# Patient Record
Sex: Male | Born: 1995 | Race: Black or African American | Hispanic: No | Marital: Single | Smoking: Former smoker
Health system: Southern US, Community
[De-identification: ages and names within clinical notes are randomized; demographics above are authoritative.]

## PROBLEM LIST (undated history)

## (undated) DIAGNOSIS — Z789 Other specified health status: Secondary | ICD-10-CM

## (undated) DIAGNOSIS — K2970 Gastritis, unspecified, without bleeding: Secondary | ICD-10-CM

## (undated) DIAGNOSIS — E8889 Other specified metabolic disorders: Secondary | ICD-10-CM

## (undated) DIAGNOSIS — E7889 Other lipoprotein metabolism disorders: Secondary | ICD-10-CM

## (undated) DIAGNOSIS — Z7289 Other problems related to lifestyle: Secondary | ICD-10-CM

## (undated) DIAGNOSIS — F1090 Alcohol use, unspecified, uncomplicated: Secondary | ICD-10-CM

## (undated) HISTORY — DX: Other specified metabolic disorders: E88.89

## (undated) HISTORY — DX: Other problems related to lifestyle: Z72.89

## (undated) HISTORY — DX: Other specified health status: Z78.9

## (undated) HISTORY — DX: Other lipoprotein metabolism disorders: E78.89

## (undated) HISTORY — DX: Alcohol use, unspecified, uncomplicated: F10.90

---

## 2020-04-20 ENCOUNTER — Encounter (HOSPITAL_COMMUNITY): Payer: Self-pay

## 2020-04-20 ENCOUNTER — Other Ambulatory Visit: Payer: Self-pay

## 2020-04-20 ENCOUNTER — Ambulatory Visit (HOSPITAL_COMMUNITY)
Admission: EM | Admit: 2020-04-20 | Discharge: 2020-04-20 | Disposition: A | Discharge status: Home / Self Care | Payer: Medicaid Other | Attending: Emergency Medicine | Admitting: Emergency Medicine

## 2020-04-20 DIAGNOSIS — R1013 Epigastric pain: Secondary | ICD-10-CM | POA: Diagnosis not present

## 2020-04-20 DIAGNOSIS — R140 Abdominal distension (gaseous): Secondary | ICD-10-CM

## 2020-04-20 HISTORY — DX: Gastritis, unspecified, without bleeding: K29.70

## 2020-04-20 MED ORDER — SIMETHICONE 80 MG PO CHEW: 80.0000 mg | CHEWABLE_TABLET | Freq: Four times a day (QID) | ORAL | 0 refills | Status: DC | PRN

## 2020-04-20 MED ORDER — OMEPRAZOLE 20 MG PO CPDR: 20.0000 mg | DELAYED_RELEASE_CAPSULE | Freq: Every day | ORAL | 0 refills | Status: DC

## 2020-04-20 NOTE — ED Triage Notes (Signed)
Pt c/o epigastric pain and loose BMsx5 days.

## 2020-04-20 NOTE — Discharge Instructions (Signed)
Eat plenty of fiber in your diet, as well as plenty of water.  Daily omeprazole to help prevent upper abdominal pain.  Simethicone every 6 hours as needed for bloating.  Please follow up with your primary care provider as scheduled in September as you may need further evaluation or treatment if symptoms persist.

## 2020-04-20 NOTE — ED Provider Notes (Signed)
[facility]    CSN: [id number] Arrival date & time: 04/20/20  1648      History   Chief Complaint Chief Complaint  Patient presents with  . Abdominal Pain    HPI Joshua Rush is a 24 y.o. male.   [name] presents with complaints of abdominal pain after eating. Had bloating this evening which has resolved. Some associated headache. Has had gastritis in the past. No nausea or vomiting. No diarrhea, but does have soft stool. Symptoms started 6 days ago. Does experience some burning to epigastric abdomen and chest, after eating. Hasn't taken any medications for symptoms. Has had similar when he lived in [place] and was given medications for symptoms- magnesium-which he feels helped. He moved here two weeks ago as a refugee.    ROS per HPI, negative if not otherwise mentioned.      Past Medical History:  Diagnosis Date  . Gastritis     There are no problems to display for this patient.   History reviewed. No pertinent surgical history.     Home Medications    Prior to Admission medications   Medication Sig Start Date End Date Taking? Authorizing Provider  omeprazole (PRILOSEC) 20 MG capsule Take 1 capsule (20 mg total) by mouth daily. 04/20/20   [name], NP  simethicone (GAS-X) 80 MG chewable tablet Chew 1 tablet (80 mg total) by mouth every 6 (six) hours as needed (bloating). 04/20/20   [name], NP    Family History No family history on file.  Social History Social History   Tobacco Use  . Smoking status: Former Smoker  Substance Use Topics  . Alcohol use: Not Currently  . Drug use: Never     Allergies   Patient has no known allergies.   Review of Systems Review of Systems   Physical Exam Triage Vital Signs ED Triage Vitals  Enc Vitals Group     BP 04/20/20 1819 (!) 141/93     Pulse Rate 04/20/20 1819 65     Resp 04/20/20 1819 16     Temp 04/20/20 1819 98.8 F (37.1 C)     Temp Source 04/20/20 1819 Oral       SpO2 04/20/20 1819 100 %     Weight 04/20/20 1820 176 lb 5.9 oz (80 kg)     Height 04/20/20 1820 6\' 2"  (1.88 m)     Head Circumference --      Peak Flow --      Pain Score 04/20/20 1820 0     Pain Loc --      Pain Edu? --      Excl. in GC? --    No data found.  Updated Vital Signs BP (!) 141/93   Pulse 65   Temp 98.8 F (37.1 C) (Oral)   Resp 16   Ht 6\' 2"  (1.88 m)   Wt 176 lb 5.9 oz (80 kg)   SpO2 100%   BMI 22.64 kg/m   Visual Acuity Right Eye Distance:   Left Eye Distance:   Bilateral Distance:    Right Eye Near:   Left Eye Near:    Bilateral Near:     Physical Exam Constitutional:      Appearance: He is well-developed.  Cardiovascular:     Rate and Rhythm: Normal rate.  Pulmonary:     Effort: Pulmonary effort is normal.  Abdominal:     Tenderness: There is no abdominal tenderness.  Skin:  General: Skin is warm and dry.  Neurological:     Mental Status: He is alert and oriented to person, place, and time.      UC Treatments / Results  Labs (all labs ordered are listed, but only abnormal results are displayed) Labs Reviewed - No data to display  EKG   Radiology No results found.  Procedures Procedures (including critical care time)  Medications Ordered in UC Medications - No data to display  Initial Impression / Assessment and Plan / UC Course  I have reviewed the triage vital signs and the nursing notes.  Pertinent labs & imaging results that were available during my care of the patient were reviewed by me and considered in my medical decision making (see chart for details).     Non toxic. Benign physical exam.  No current symptoms. History of gastritis. Abdominal pain and bloating after eating, intermittent. Worse over the past 6 days, moved here only 2 weeks ago from [place]. Change in diet may be contributing. Gastritis still considered as well. omperazole provided for daily use, simethicone PRN. Has appointment with a PCP next  month, encouraged follow up for recheck as may need further eval. H.pylori may need testing as well. Patient verbalized understanding and agreeable to plan.   Final Clinical Impressions(s) / UC Diagnoses   Final diagnoses:  Epigastric pain  Abdominal bloating     Discharge Instructions     Eat plenty of fiber in your diet, as well as plenty of water.  Daily omeprazole to help prevent upper abdominal pain.  Simethicone every 6 hours as needed for bloating.  Please follow up with your primary care provider as scheduled in September as you may need further evaluation or treatment if symptoms persist.     ED Prescriptions    Medication Sig Dispense Auth. Provider   omeprazole (PRILOSEC) 20 MG capsule Take 1 capsule (20 mg total) by mouth daily. 30 capsule Linus Mako B, NP   simethicone (GAS-X) 80 MG chewable tablet Chew 1 tablet (80 mg total) by mouth every 6 (six) hours as needed (bloating). 30 tablet [name], NP     PDMP not reviewed this encounter.   [name], NP 04/20/20 Windell Moment

## 2020-05-11 LAB — HEPATITIS B SURFACE ANTIGEN: Hepatitis B Surface Ag: NEGATIVE

## 2020-05-15 ENCOUNTER — Ambulatory Visit: Payer: Self-pay

## 2020-05-15 ENCOUNTER — Other Ambulatory Visit: Payer: Self-pay

## 2020-05-15 ENCOUNTER — Ambulatory Visit (INDEPENDENT_AMBULATORY_CARE_PROVIDER_SITE_OTHER): Payer: Medicaid Other | Admitting: Family Medicine

## 2020-05-15 ENCOUNTER — Other Ambulatory Visit (HOSPITAL_COMMUNITY)
Admission: RE | Admit: 2020-05-15 | Discharge: 2020-05-15 | Disposition: A | Discharge status: Home / Self Care | Payer: Medicaid Other | Source: Ambulatory Visit | Attending: Family Medicine | Admitting: Family Medicine

## 2020-05-15 VITALS — BP 104/62 | HR 98 | Ht 73.0 in | Wt 190.0 lb

## 2020-05-15 DIAGNOSIS — Z0289 Encounter for other administrative examinations: Secondary | ICD-10-CM | POA: Insufficient documentation

## 2020-05-15 DIAGNOSIS — R1013 Epigastric pain: Secondary | ICD-10-CM | POA: Diagnosis not present

## 2020-05-15 LAB — POCT UA - MICROSCOPIC ONLY

## 2020-05-15 LAB — POCT URINALYSIS DIP (MANUAL ENTRY)
Bilirubin, UA: NEGATIVE
Blood, UA: NEGATIVE
Glucose, UA: NEGATIVE mg/dL
Ketones, POC UA: NEGATIVE mg/dL
Leukocytes, UA: NEGATIVE
Nitrite, UA: NEGATIVE
Protein Ur, POC: 30 mg/dL — AB
Spec Grav, UA: >=1.03 — AB (ref 1.010–1.025)
Urobilinogen, UA: 1 E.U./dL
pH, UA: 5.5 (ref 5.0–8.0)

## 2020-05-15 NOTE — Patient Instructions (Addendum)
It was wonderful to see you today.  Please bring ALL of your medications with you to every visit.   Today we talked about:  - We will check your blood work for anemia and infection - At follow up, we will look further into your stomach pain and sore throat  Follow up October 4th at 11:10 AM--- at [address]   Thank you for choosing [facility].   Please call [phone] with any questions about today's appointment.  Please be sure to schedule follow up at the front  desk before you leave today.   Terisa Starr, MD  Family Medicine

## 2020-05-15 NOTE — Progress Notes (Signed)
Patient Name: Joshua Rush Date of Birth: 10/23/1995 Date of Visit: 05/15/20 PCP: Dana Allan, MD  Chief Complaint: refugee intake examination and concerns about "stomach discomfort and chest burning"  The patient's preferred language is [place]. An interpreter was not used for the entire visit at the patient's request. Interpreter services were offered throughout the interview.   Subjective: Joshua Rush is a pleasant 24 y.o. presenting today for an initial refugee and immigrant clinic visit. He reports that he is slowly learning how things are done here. Discussed stressors of relocation.  The patient reports a several year history of abdominal pain. Located in epigastric area, sometimes severe. This is associated with loose stools and sometimes floating stools. Has burning pain and associated sore throat. No nausea, vomiting, regurgitation, dysphagia, odynophagia, hematochezia, melena, weight loss, or fevers. Previously diagnosed with steatosis. Worsened by EtOH, improved by simethicone.   ROS: (+) for heartburn, stomach discomfort, intermittent headache, intermittent bloating relieved with simethicone; 3-4 bowel movements a day, soft, non-formed, non-foul smelling, non-floating (-) CP, SOB, MSK aches and pain, numbness and tingling, increased thirst, increased appetite, changes in mood, changes in urination and penile discharge  PMH: Hx dx of gastritis and fatty liver; remote hx of a month long period of limb swelling when he was a child. It resolved after a month (he believes he was hospitalized for it, but doesn't have any further information). He has not had any similar symptoms ever afterwards. Denies every having any infections or receiving treatment for the same.  PSH: None  FH: Denies any cardiac or cancer history in his family. Mother has HIV   Soc Hx Lives with mom and four other siblings/ relatives. Large family.  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 CAGE essentially negative at this time. Hx of smoking; denies current smoking. Denies recreational drug use.  Lifts weights occasionally, but is not doing much as he is still trying to acculturate. Is interested in learning how to swim and wants to play basketball.  Is sexual active and had a girlfriend back in [place].  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    Current Medications:  Simethicone PRN;  Has not taken prescribed omeprazole   Refugee Information Number of Immediate Family Members: 5 Number of Immediate Family Members in [place]: 5 Date of Arrival: 04/12/20 Country of Birth: Other Other Country of Birth:: [place] Country of Origin: Other Other Country of Origin:: [place] Location of Refugee [place]: Other Other Location of Refugee Camp:: [place] Duration in [place]: 20 years or greater Reason for Leaving Home Country: Other Other Reason for Leaving Home Country:: War in [facility] Primary Language: English, Other Other Primary Language:: [place] ([place]) Able to Read in Primary Language: Yes Able to Write in Primary Language: Yes Education: [employer] (did not graduate) Prior Work: mobile money Marketing executive) Marital Status: Single Sexual Activity: Yes (girlfriend) Tuberculosis Screening Overseas: Negative Health Department Labs Completed: Yes History of Trauma: None Do You Feel Jumpy or Nervous?: No Are You Very Watchful or 'Super Alert'?: No   Date of [place]verse[place] Exam: 15 May 2019 Review of [place]verseas Exam: 15 May 2020 Pre-Departure Treatment: Ivermectin + coartem + praziquantel + albendazole  Overseas Vaccines Reviewed and Updated in Epic      Vitals:   05/15/20 1408  BP: 104/62  Pulse: 98  SpO2: 98%   HEENT: Sclera anicteric. Dentition is good. Appears well hydrated.  Neck: Supple; no palpable lymph nodes present Cardiac: Regular rate and rhythm. Normal S1/S2.  No murmurs, rubs, or gallops appreciated. Lungs: Clear bilaterally to ascultation. Normal work of breathing. No cough present. Abdomen: Normoactive bowel sounds. Focal tenderness to deep or light palpation at mid-epigastrum (itching versus light discomfort). No rebound or guarding. Splenomegaly not present  Extremities: Warm, well perfused without edema.  Skin: warm, dry;   Psych: Pleasant and appropriate  MSK: some right knee pain in the past, nothing today  Gale was seen today for back pain and abdominal pain.  Diagnoses and all orders for this visit:  Refugee health examination -     RPR -     HIV Antibody (routine testing w rflx) -     CBC with Differential/Platelet -     Comprehensive metabolic panel -     Varicella zoster antibody, IgG -     Hepatitis B surface antigen -     Hepatitis B core antibody, total -     Hepatitis B surface antibody,quantitative -     POCT urinalysis dipstick -     Lipid panel -     TSH -     Urine cytology ancillary only -     Strongyloides, Ab, IgG -     HCV Ab w Reflex to Quant PCR -     POCT UA - Microscopic Only  Dyspepsia, most likely gastritis related to EtOH. Also considered functional dyspepsia, GERD, H. Pylori, less likely IBD given lack of other symptoms.   Reports chronic issues with bloating and substernal burning. He reports intermittent sore throat associated with the chest burning, along with intermittent bloating. History of moderate to heavy alcohol consumption while in [place] along with current alcohol consumption (less per patient). History of gastritis and reported fatty liver diagnosis recently. Symptoms are worse with meat; has had a vegetarian diet prescribed to him in the past with symptom resolution. Counseled patient on reducing or stopping alcohol use; patient reports a willingness to stop and believes he can.  -- H. Pylori testing at Oct visit (explained that he shouldn't take the previously prescribed  omeprazole) --Tylenol 500mg  q6hrs PRN headache and chest discomfort; educated about max dosing per day --Cessation of alcohol use encouraged; f/u with possible naltrexone at next visit --Consider H2 blocker at next visit    Release of information signed for Health Department Yes .   Return to care in 3 weeks (May 28, 2020) in [facility] with resident physician and PCP.  Vaccines: Updated.     [facility], MS4

## 2020-05-16 LAB — CBC WITH DIFFERENTIAL/PLATELET
Basophils Absolute: 0 10*3/uL (ref 0.0–0.2)
Basos: 0 %
EOS (ABSOLUTE): 0 10*3/uL (ref 0.0–0.4)
Eos: 1 %
Hematocrit: 44.2 % (ref 37.5–51.0)
Hemoglobin: 14.9 g/dL (ref 13.0–17.7)
Immature Grans (Abs): 0 10*3/uL (ref 0.0–0.1)
Immature Granulocytes: 0 %
Lymphocytes Absolute: 1.5 10*3/uL (ref 0.7–3.1)
Lymphs: 26 %
MCH: 26.7 pg (ref 26.6–33.0)
MCHC: 33.7 g/dL (ref 31.5–35.7)
MCV: 79 fL (ref 79–97)
Monocytes Absolute: 0.9 10*3/uL (ref 0.1–0.9)
Monocytes: 15 %
Neutrophils Absolute: 3.4 10*3/uL (ref 1.4–7.0)
Neutrophils: 58 %
Platelets: 112 10*3/uL — ABNORMAL LOW (ref 150–450)
RBC: 5.59 x10E6/uL (ref 4.14–5.80)
RDW: 11.9 % (ref 11.6–15.4)
WBC: 5.8 10*3/uL (ref 3.4–10.8)

## 2020-05-16 LAB — HEPATITIS B CORE ANTIBODY, TOTAL: Hep B Core Total Ab: NEGATIVE

## 2020-05-16 LAB — COMPREHENSIVE METABOLIC PANEL
ALT: 19 IU/L (ref 0–44)
AST: 19 IU/L (ref 0–40)
Albumin/Globulin Ratio: 1.3 (ref 1.2–2.2)
Albumin: 4.5 g/dL (ref 4.1–5.2)
Alkaline Phosphatase: 124 IU/L — ABNORMAL HIGH (ref 44–121)
BUN/Creatinine Ratio: 6 — ABNORMAL LOW (ref 9–20)
BUN: 7 mg/dL (ref 6–20)
Bilirubin Total: 1.2 mg/dL (ref 0.0–1.2)
CO2: 23 mmol/L (ref 20–29)
Calcium: 9.5 mg/dL (ref 8.7–10.2)
Chloride: 101 mmol/L (ref 96–106)
Creatinine, Ser: 1.16 mg/dL (ref 0.76–1.27)
GFR calc Af Amer: 103 mL/min/{1.73_m2} (ref >59)
GFR calc non Af Amer: 90 mL/min/{1.73_m2} (ref >59)
Globulin, Total: 3.5 g/dL (ref 1.5–4.5)
Glucose: 98 mg/dL (ref 65–99)
Potassium: 3.6 mmol/L (ref 3.5–5.2)
Sodium: 137 mmol/L (ref 134–144)
Total Protein: 8 g/dL (ref 6.0–8.5)

## 2020-05-16 LAB — LIPID PANEL
Chol/HDL Ratio: 4.8 ratio (ref 0.0–5.0)
Cholesterol, Total: 159 mg/dL (ref 100–199)
HDL: 33 mg/dL — ABNORMAL LOW (ref >39)
LDL Chol Calc (NIH): 99 mg/dL (ref 0–99)
Triglycerides: 155 mg/dL — ABNORMAL HIGH (ref 0–149)
VLDL Cholesterol Cal: 27 mg/dL (ref 5–40)

## 2020-05-16 LAB — URINE CYTOLOGY ANCILLARY ONLY
Chlamydia: NEGATIVE
Comment: NEGATIVE
Comment: NORMAL
Neisseria Gonorrhea: NEGATIVE

## 2020-05-16 LAB — HCV AB W REFLEX TO QUANT PCR: HCV Ab: <0.1 s/co ratio (ref 0.0–0.9)

## 2020-05-16 LAB — VARICELLA ZOSTER ANTIBODY, IGG: Varicella zoster IgG: <135 index — ABNORMAL LOW (ref >165)

## 2020-05-16 LAB — HCV INTERPRETATION

## 2020-05-16 LAB — HEPATITIS B SURFACE ANTIBODY, QUANTITATIVE: Hepatitis B Surf Ab Quant: >1000 m[IU]/mL (ref >9.9)

## 2020-05-16 LAB — RPR: RPR Ser Ql: NONREACTIVE

## 2020-05-16 LAB — TSH: TSH: 3.68 u[IU]/mL (ref 0.450–4.500)

## 2020-05-16 LAB — HIV ANTIBODY (ROUTINE TESTING W REFLEX): HIV Screen 4th Generation wRfx: NONREACTIVE

## 2020-05-19 ENCOUNTER — Telehealth: Payer: Self-pay | Admitting: Family Medicine

## 2020-05-19 LAB — STRONGYLOIDES, AB, IGG: Strongyloides, Ab, IgG: NEGATIVE

## 2020-05-19 NOTE — Telephone Encounter (Signed)
Called patient with results.  Varicella non-immune.   - Obtain H. Pylori at follow up given stomach symptoms.  - Repeat CBC with differential and smear (thrombocytopenia).  - Needs varicella at follow up  Terisa Starr, MD  [facility] Medicine Teaching Service

## 2020-05-22 ENCOUNTER — Encounter: Payer: Self-pay | Admitting: Family Medicine

## 2020-05-22 DIAGNOSIS — R7612 Nonspecific reaction to cell mediated immunity measurement of gamma interferon antigen response without active tuberculosis: Secondary | ICD-10-CM | POA: Insufficient documentation

## 2020-05-28 ENCOUNTER — Ambulatory Visit: Payer: Medicaid Other | Admitting: Family Medicine

## 2020-05-28 ENCOUNTER — Telehealth: Payer: Self-pay | Admitting: Family Medicine

## 2020-05-28 NOTE — Telephone Encounter (Signed)
Attempted to call X1 about missed appointment.  Terisa Starr, MD  Family Medicine Teaching Service

## 2020-06-12 ENCOUNTER — Other Ambulatory Visit: Payer: Self-pay

## 2020-06-12 ENCOUNTER — Ambulatory Visit (HOSPITAL_COMMUNITY)
Admission: EM | Admit: 2020-06-12 | Discharge: 2020-06-12 | Disposition: A | Discharge status: Home / Self Care | Payer: Medicaid Other | Attending: Family Medicine | Admitting: Family Medicine

## 2020-06-12 ENCOUNTER — Encounter (HOSPITAL_COMMUNITY): Payer: Self-pay

## 2020-06-12 DIAGNOSIS — K219 Gastro-esophageal reflux disease without esophagitis: Secondary | ICD-10-CM

## 2020-06-12 MED ORDER — FAMOTIDINE 20 MG PO TABS: 20.0000 mg | ORAL_TABLET | Freq: Two times a day (BID) | ORAL | 0 refills | Status: DC

## 2020-06-12 MED ORDER — LIDOCAINE VISCOUS HCL 2 % MT SOLN
15.0000 mL | Freq: Once | OROMUCOSAL | Status: AC
  Administered 2020-06-12: 15 mL via ORAL

## 2020-06-12 MED ORDER — LIDOCAINE VISCOUS HCL 2 % MT SOLN
OROMUCOSAL | Status: AC
  Filled 2020-06-12: qty 15

## 2020-06-12 MED ORDER — ALUM & MAG HYDROXIDE-SIMETH 200-200-20 MG/5ML PO SUSP
30.0000 mL | Freq: Once | ORAL | Status: AC
  Administered 2020-06-12: 30 mL via ORAL

## 2020-06-12 MED ORDER — ALUM & MAG HYDROXIDE-SIMETH 200-200-20 MG/5ML PO SUSP
ORAL | Status: AC
  Filled 2020-06-12: qty 30

## 2020-06-12 NOTE — ED Provider Notes (Signed)
[facility]    CSN: [id number] Arrival date & time: 06/12/20  1117      History   Chief Complaint Chief Complaint  Patient presents with  . Gastroesophageal Reflux    HPI Joshua Rush is a 24 y.o. male.   Patient is a 24 year old male with past medical history of gastritis, steatosis, alcohol use. He presents today for burning in chest, heartburn after eating spicy food itching concerns for the past 3 days. Worse at bedtime. Has not taking medication for symptoms. No nausea, vomiting, diarrhea, chest pain or shortness of breath.     Past Medical History:  Diagnosis Date  . Alcohol use   . Gastritis   . Steatosis     Patient Active Problem List   Diagnosis Date Noted  . 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 Refugee health examination 05/15/2020    History reviewed. No pertinent surgical history.     Home Medications    Prior to Admission medications   Medication Sig Start Date End Date Taking? Authorizing Provider  famotidine (PEPCID) 20 MG tablet Take 1 tablet (20 mg total) by mouth 2 (two) times daily. 06/12/20   Dahlia Byes A, NP  omeprazole (PRILOSEC) 20 MG capsule Take 1 capsule (20 mg total) by mouth daily. Patient not taking: Reported on 05/15/2020 04/20/20 06/12/20  [name], NP  simethicone (GAS-X) 80 MG chewable tablet Chew 1 tablet (80 mg total) by mouth every 6 (six) hours as needed (bloating). 04/20/20 06/12/20  [name], NP    Family History Family History  Problem Relation Age of Onset  . HIV Mother     Social History Social History   Tobacco Use  . Smoking status: Former Games developer  . Smokeless tobacco: Never Used  Substance Use Topics  . Alcohol use: Yes    Comment: intermittently drinks > 5 drinks per day   . Drug use: Never     Allergies   Patient has no known allergies.   Review of Systems Review of Systems   Physical Exam Triage Vital Signs ED Triage Vitals  Enc Vitals Group     BP  06/12/20 1147 (!) 143/78     Pulse Rate 06/12/20 1147 73     Resp 06/12/20 1147 16     Temp 06/12/20 1147 98.4 F (36.9 C)     Temp Source 06/12/20 1147 Oral     SpO2 06/12/20 1147 97 %     Weight --      Height --      Head Circumference --      Peak Flow --      Pain Score 06/12/20 1148 4     Pain Loc --      Pain Edu? --      Excl. in GC? --    No data found.  Updated Vital Signs BP (!) 143/78 (BP Location: Right Arm)   Pulse 73   Temp 98.4 F (36.9 C) (Oral)   Resp 16   SpO2 97%   Visual Acuity Right Eye Distance:   Left Eye Distance:   Bilateral Distance:    Right Eye Near:   Left Eye Near:    Bilateral Near:     Physical Exam Vitals and nursing note reviewed.  Constitutional:      Appearance: Normal appearance.  HENT:     Head: Normocephalic and atraumatic.     Nose: Nose normal.  Eyes:     Conjunctiva/sclera: Conjunctivae normal.  Pulmonary:     Effort: Pulmonary effort is normal.  Musculoskeletal:        General: Normal range of motion.     Cervical back: Normal range of motion.  Skin:    General: Skin is warm and dry.  Neurological:     Mental Status: He is alert.  Psychiatric:        Mood and Affect: Mood normal.      UC Treatments / Results  Labs (all labs ordered are listed, but only abnormal results are displayed) Labs Reviewed - No data to display  EKG   Radiology No results found.  Procedures Procedures (including critical care time)  Medications Ordered in UC Medications  alum & mag hydroxide-simeth (MAALOX/MYLANTA) 200-200-20 MG/5ML suspension 30 mL (30 mLs Oral Given 06/12/20 1224)    And  lidocaine (XYLOCAINE) 2 % viscous mouth solution 15 mL (15 mLs Oral Given 06/12/20 1224)    Initial Impression / Assessment and Plan / UC Course  I have reviewed the triage vital signs and the nursing notes.  Pertinent labs & imaging results that were available during my care of the patient were reviewed by me and considered in my  medical decision making (see chart for details).     GERD GI cocktail given here in clinic. Will start Pepcid twice a day as needed. Diet precautions given Recommended follow-up with GI for any continued problems Final Clinical Impressions(s) / UC Diagnoses   Final diagnoses:  Gastroesophageal reflux disease without esophagitis     Discharge Instructions     I believe your symptoms are associated with acid reflux.  Pepcid 2 times a day as needed.  Avoid spicy, greasy foods, caffeine, chocolate and milk products.  No eating 2-3 hours before bedtime. Elevate the head of the bed 30 degrees.  Try this for a few weeks to see if this improves your symptoms.  If you don't see any improvement or your symptoms worsen please follow up with a GI      ED Prescriptions    Medication Sig Dispense Auth. Provider   famotidine (PEPCID) 20 MG tablet Take 1 tablet (20 mg total) by mouth 2 (two) times daily. 30 tablet Dahlia Byes A, NP     PDMP not reviewed this encounter.   Janace Aris, NP 06/12/20 1248

## 2020-06-12 NOTE — Discharge Instructions (Signed)
I believe your symptoms are associated with acid reflux.  Pepcid 2 times a day as needed.  Avoid spicy, greasy foods, caffeine, chocolate and milk products.  No eating 2-3 hours before bedtime. Elevate the head of the bed 30 degrees.  Try this for a few weeks to see if this improves your symptoms.  If you don't see any improvement or your symptoms worsen please follow up with a GI

## 2020-06-12 NOTE — ED Triage Notes (Signed)
Pt reports having heartburn after eating spicy food and drinking sodas for the past 3 days. Worsens at bedtime.

## 2020-06-18 ENCOUNTER — Ambulatory Visit (INDEPENDENT_AMBULATORY_CARE_PROVIDER_SITE_OTHER): Payer: Medicaid Other | Admitting: Family Medicine

## 2020-06-18 ENCOUNTER — Ambulatory Visit
Admission: RE | Admit: 2020-06-18 | Discharge: 2020-06-18 | Disposition: A | Discharge status: Home / Self Care | Payer: Medicaid Other | Source: Ambulatory Visit | Attending: Family Medicine | Admitting: Family Medicine

## 2020-06-18 ENCOUNTER — Encounter: Payer: Self-pay | Admitting: Family Medicine

## 2020-06-18 ENCOUNTER — Other Ambulatory Visit: Payer: Self-pay

## 2020-06-18 VITALS — BP 100/65 | HR 78 | Ht 73.23 in | Wt 190.4 lb

## 2020-06-18 DIAGNOSIS — D696 Thrombocytopenia, unspecified: Secondary | ICD-10-CM | POA: Diagnosis not present

## 2020-06-18 DIAGNOSIS — S63043A Subluxation of carpometacarpal joint of unspecified thumb, initial encounter: Secondary | ICD-10-CM

## 2020-06-18 DIAGNOSIS — M79641 Pain in right hand: Secondary | ICD-10-CM

## 2020-06-18 DIAGNOSIS — Z23 Encounter for immunization: Secondary | ICD-10-CM

## 2020-06-18 DIAGNOSIS — R1013 Epigastric pain: Secondary | ICD-10-CM

## 2020-06-18 HISTORY — DX: Subluxation of carpometacarpal joint of unspecified thumb, initial encounter: S63.043A

## 2020-06-18 HISTORY — DX: Epigastric pain: R10.13

## 2020-06-18 NOTE — Patient Instructions (Addendum)
It was wonderful to see you today.  Please bring ALL of your medications with you to every visit.   Today we talked about:  = Checking for H. Pylori   - Go to the lab for blood work   - Going to get a X-ray   Thank you for choosing [facility].   Please call [phone] with any questions about today's appointment.  Please be sure to schedule follow up at the front  desk before you leave today.   Terisa Starr, MD  Family Medicine

## 2020-06-18 NOTE — Assessment & Plan Note (Signed)
Most consistent with reflux.  We discussed the physiology of this related to spicy and greasy foods that are commonly soda fast food chains in the [place] states.  Encourage patient to eat more traditional foods any with his family at night.  Will test for H. pylori today as he is not taking an H2 blocker.  Instructed the patient that I would call him with results.
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
## 2020-06-19 LAB — CBC
Hematocrit: 45.6 % (ref 37.5–51.0)
Hemoglobin: 15 g/dL (ref 13.0–17.7)
MCH: 26.8 pg (ref 26.6–33.0)
MCHC: 32.9 g/dL (ref 31.5–35.7)
MCV: 82 fL (ref 79–97)
Platelets: 109 10*3/uL — ABNORMAL LOW (ref 150–450)
RBC: 5.59 x10E6/uL (ref 4.14–5.80)
RDW: 12.4 % (ref 11.6–15.4)
WBC: 3.4 10*3/uL (ref 3.4–10.8)

## 2020-06-19 LAB — HEPATIC FUNCTION PANEL
ALT: 10 IU/L (ref 0–44)
AST: 11 IU/L (ref 0–40)
Albumin: 4.6 g/dL (ref 4.1–5.2)
Alkaline Phosphatase: 124 IU/L — ABNORMAL HIGH (ref 44–121)
Bilirubin Total: 1 mg/dL (ref 0.0–1.2)
Bilirubin, Direct: 0.24 mg/dL (ref 0.00–0.40)
Total Protein: 7.6 g/dL (ref 6.0–8.5)

## 2020-06-19 LAB — H. PYLORI BREATH TEST: H pylori Breath Test: POSITIVE — AB

## 2020-06-20 ENCOUNTER — Telehealth: Payer: Self-pay | Admitting: Family Medicine

## 2020-06-20 ENCOUNTER — Encounter: Payer: Self-pay | Admitting: Family Medicine

## 2020-06-20 NOTE — Telephone Encounter (Signed)
Left voicemail with [facility] interpreter to call back regarding results.  Patient is H. pylori positive, this is possibly the cause of his thrombocytopenia although I am not convinced of this.  He will likely need close repeat of his platelet count to ensure this does not continue to drop.  Other things to consider would be myelodysplasia ITP or other infection.  To treat the H. pylori I am going to recommend standard dose PPI such as omeprazole or pantoprazole twice daily, clarithromycin 500 mg twice daily and amoxicillin 1 g twice daily for 14 days each.  This is the easiest regimen as all of the medications are twice daily.  Will discuss with patient.

## 2020-06-21 ENCOUNTER — Encounter: Payer: Self-pay | Admitting: Family Medicine

## 2020-06-25 ENCOUNTER — Telehealth: Payer: Self-pay | Admitting: Family Medicine

## 2020-06-25 NOTE — Telephone Encounter (Signed)
Attempted to call patient--unable to reach X1.   Terisa Starr, MD  Family Medicine Teaching Service

## 2020-06-27 ENCOUNTER — Telehealth: Payer: Self-pay | Admitting: Family Medicine

## 2020-06-27 NOTE — Telephone Encounter (Signed)
Attempted to call patient and brother again about lab results.  Joshua Starr, MD  Family Medicine Teaching Service

## 2020-06-27 NOTE — Telephone Encounter (Signed)
Brother will be available around 9am in the morning if Dr. Manson Passey can call then. Jone Baseman, CMA

## 2020-06-28 NOTE — Telephone Encounter (Signed)
Attempted to call patient at 919 and 929 AM. Unable to reach. Nursing- please call patient and schedule with PCP for discussion of therapy for H. Pylori.   Terisa Starr, MD  Family Medicine Teaching Service

## 2020-06-28 NOTE — Telephone Encounter (Signed)
Attempted to call patient and there was no answer.  LVM for patient to call office to schedule appointment.  Glennie Hawk, CMA

## 2020-07-03 ENCOUNTER — Ambulatory Visit: Payer: Medicaid Other | Admitting: Family Medicine

## 2020-07-04 ENCOUNTER — Telehealth: Payer: Self-pay | Admitting: Family Medicine

## 2020-07-04 NOTE — Telephone Encounter (Signed)
Attempted to call patient about missed appointment. Patient did not answer. Will send another letter to schedule with [place].   Terisa Starr, MD  Family Medicine Teaching Service

## 2020-07-06 ENCOUNTER — Encounter: Payer: Self-pay | Admitting: Family Medicine

## 2020-07-06 ENCOUNTER — Other Ambulatory Visit: Payer: Self-pay

## 2020-07-06 ENCOUNTER — Ambulatory Visit (INDEPENDENT_AMBULATORY_CARE_PROVIDER_SITE_OTHER): Payer: Medicaid Other | Admitting: Family Medicine

## 2020-07-06 VITALS — BP 122/80 | HR 78 | Ht 73.0 in | Wt 191.5 lb

## 2020-07-06 DIAGNOSIS — H60399 Other infective otitis externa, unspecified ear: Secondary | ICD-10-CM

## 2020-07-06 DIAGNOSIS — S63041D Subluxation of carpometacarpal joint of right thumb, subsequent encounter: Secondary | ICD-10-CM

## 2020-07-06 DIAGNOSIS — H60392 Other infective otitis externa, left ear: Secondary | ICD-10-CM

## 2020-07-06 DIAGNOSIS — R1013 Epigastric pain: Secondary | ICD-10-CM | POA: Diagnosis not present

## 2020-07-06 DIAGNOSIS — A048 Other specified bacterial intestinal infections: Secondary | ICD-10-CM | POA: Diagnosis not present

## 2020-07-06 DIAGNOSIS — M66249 Spontaneous rupture of extensor tendons, unspecified hand: Secondary | ICD-10-CM

## 2020-07-06 DIAGNOSIS — D696 Thrombocytopenia, unspecified: Secondary | ICD-10-CM

## 2020-07-06 HISTORY — DX: Other infective otitis externa, unspecified ear: H60.399

## 2020-07-06 MED ORDER — CLARITHROMYCIN 500 MG PO TABS: 500.0000 mg | ORAL_TABLET | Freq: Two times a day (BID) | ORAL | 0 refills | Status: DC

## 2020-07-06 MED ORDER — OMEPRAZOLE 40 MG PO CPDR: 40.0000 mg | DELAYED_RELEASE_CAPSULE | Freq: Two times a day (BID) | ORAL | 0 refills | Status: DC

## 2020-07-06 MED ORDER — AMOXICILLIN 500 MG PO CAPS: 1000.0000 mg | ORAL_CAPSULE | Freq: Two times a day (BID) | ORAL | 0 refills | Status: DC

## 2020-07-06 NOTE — Assessment & Plan Note (Addendum)
Patient has itchy auricle that has progressed to two days of burning pain. Physical exam notable for erythema and possible scratch. Gave samples of triple antibiotic ointment to apply to area twice a day for one week.

## 2020-07-06 NOTE — Patient Instructions (Addendum)
It was a pleasure to see you today!  1. To treat H. Pylori: - take amoxicillin 1g twice a day (this will be 2 pills in the morning, 2 pills at night) for 14 days - take clarithromycin 500 mg twice a day for 14 days - take omeprazole 40mg  twice a day for 14 days - Follow up on Friday 08/10/20 at 9:10 AM with Dr. 08/12/20  2. For your thumb: I recommend getting a brace for your thumb to prevent it from popping out of socket. You can find a brace at [facility], [facility], [facility], etc. I have referred you to a [facility]. Expect a phone call to schedule that appointment in 2 weeks. If you have not received a phone call in 2 weeks for a hand surgery appointment, please call our office at [phone] and we can assist you.   3. For you ear: place the ointment on the part of your ear that is sore twice a day for 1 week  4. We got blood work today and I will let you know by phone next week if anything is concerning, or by letter if everything is as expected.  Be Well!  Dr. [phone]   Helicobacter Pylori Infection Helicobacter pylori infection is a bacterial infection in the stomach. Long-term (chronic) infection can cause stomach irritation (gastritis), ulcers in the stomach (gastric ulcers), and ulcers in the upper part of the intestine (duodenal ulcers). Having this infection may also increase your risk of stomach cancer and a type of white blood cell cancer (lymphoma) that affects the stomach. What are the causes? This infection is caused by the Helicobacter pylori (H. pylori) bacteria. Many healthy people have this bacteria in their stomach lining. The bacteria may also spread from person to person through contact with stool (feces) or saliva. It is not known why some people develop ulcers, gastritis, or cancer from the bacteria. What increases the risk? You are more likely to develop this condition if you:  Have family members with the infection.  Live with many other people, such as in a  dormitory.  Are of African, Hispanic, or Asian descent. What are the signs or symptoms? Most people with this infection do not have any symptoms. If you do have symptoms, they may include:  Heartburn.  Stomach pain.  Nausea.  Vomiting. The vomit may be bloody because of ulcers.  Loss of appetite.  Bad breath. How is this diagnosed? This condition may be diagnosed based on:  Your symptoms and medical history.  A physical exam.  Blood tests.  Stool tests.  A breath test.  A procedure that involves placing a tube with a camera on the end of it down your throat to examine your stomach and upper intestine (upper endoscopy).  Removing and testing a tissue sample from the stomach lining (biopsy). A biopsy may be taken during an upper endoscopy. How is this treated?  This condition is treated by taking a combination of medicines (triple therapy) for several weeks. Triple therapy includes one medicine to reduce the amount of acid in your stomach and two types of antibiotic medicines. This treatment may reduce your risk of cancer. You may need to be tested for H. pylori again after treatment. In some cases, the treatment may need to be repeated if your treatment did not get rid of all the bacteria. Follow these instructions at home:   Take over-the-counter and prescription medicines only as told by your health care provider.  Take your antibiotics  as told by your health care provider. Do not stop taking the antibiotics even if you start to feel better.  Return to your normal activities as told by your health care provider. Ask your health care provider what activities are safe for you.  Take steps to prevent future infections: ? Wash your hands often with soap and water. If soap and water are not available, use hand sanitizer. ? Do not eat food or drink water that may have had contact with stool or saliva.  Keep all follow-up visits as told by your health care provider. This  is important. You may need tests to make sure your treatment worked. Contact a health care provider if your symptoms:  Do not get better with treatment.  Return after treatment. Summary  Helicobacter pylori infection is a stomach infection caused by the Helicobacter pylori (H. pylori) bacteria.  This infection can cause stomach irritation (gastritis), ulcers in the stomach (gastric ulcers), and ulcers in the upper part of the intestine (duodenal ulcers).  This condition is treated by taking a combination of medicines (triple therapy) for several weeks.  Take your antibiotics as told by your health care provider. Do not stop taking the antibiotics even if you start to feel better. This information is not intended to replace advice given to you by your health care provider. Make sure you discuss any questions you have with your health care provider. Document Revised: 12/02/2018 Document Reviewed: 08/04/2017 Elsevier Patient Education  2020 [employer].

## 2020-07-06 NOTE — Assessment & Plan Note (Addendum)
Patient endorses continued dyspepsia that is impacting daily life. Counseled on positive test result of H. Pylori. Prescribed  - omeprazole, 40 mg bid for 14 days - clarithromycin, 500 mg bid for 14 days - amoxicillin, 1000 mg bid for 14 days Follow up on Friday 08/10/20 at 9:10 AM with Dr. Manson Passey.

## 2020-07-06 NOTE — Assessment & Plan Note (Signed)
Patient denies fatigue, easy bleeding, or easy bruising. Ordered CBC with differential to assess thrombocytopenia. Will call with results if concerning.

## 2020-07-06 NOTE — Progress Notes (Addendum)
SUBJECTIVE:   CHIEF COMPLAINT / HPI:   Joshua Rush (MRN: [id number]) is a 24 y.o. male with a history of dyspepsia and H. pylori infection who presents for follow up.  Dyspepsia Patient states he has had ongoing symptoms of intermittent burning in his chest since last visit. He says pain is 8/10 and impacting daily activities and work. He has been taking famotidine with minimal improvement. He says he did not receive phone call about positive H. pylori testing. Before moving to the [place], he endorses taking omeprazole and a "shot" that he is unsure of for this issue with no resolution of symptoms.  Pain of external auditory canal He states he has has pain around his L auricle. He states it has been very itchy, and it began to burn two days ago. He has not tried anything to help it, and the only thing that makes it worse is touching it. He denies trauma or any water in the ear outside of some in the shower.   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 He wants to know what he can do to help mediate this work-related pain.  Thrombocytopenia Patient denies any fatigue, easy bleeding, or easy bruising.  PERTINENT  PMH / PSH: dyspepsia, H. 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  OBJECTIVE:   BP 122/80   Pulse 78   Ht 6\' 1"  (1.854 m)   Wt 191 lb 8 oz (86.9 kg)   SpO2 99%   BMI 25.27 kg/m    PHYSICAL EXAM  GEN: well developed, well-nourished, in NAD CVS: RRR, normal S1/S2, no murmurs, rubs, gallops RESP: Breathing comfortably on RA, no retractions, wheezes, rhonchi, or crackles ABD: soft, non-tender, no organomegaly or masses SKIN: No lesions or rashes, mild erythema and possible scratch on L auricle EXT: Moves all extremities equally, subluxation of carpometacarpal joint of R thumb     ASSESSMENT/PLAN:   Dyspepsia Patient endorses continued dyspepsia that is impacting daily life. Counseled on positive test result of H. Pylori. Prescribed  - omeprazole, 40 mg bid for 14 days - clarithromycin, 500 mg bid for 14 days - amoxicillin, 1000 mg bid for 14 days Follow up on Friday 08/10/20 at 9:10 AM with Dr. 08/12/20.   Bacterial external ear infection Patient has itchy auricle that has progressed to two days of burning pain. Physical exam notable for erythema and possible scratch. Gave samples of triple antibiotic ointment to apply to area twice a day for one week.  Subluxation of carpometacarpal joint of thumb Patient has had continuing pain in R hand upon usage at work. Recommended getting a brace for the thumb to prevent it from popping out of socket. Counseled on finding a brace at [facility], [facility], etc. Also referred to hand surgeon and told to expect a phone call to schedule that appointment in 2 weeks.   Thrombocytopenia (HCC) Patient denies fatigue, easy bleeding, or easy bruising. Ordered CBC with differential to assess thrombocytopenia. Will call with results if concerning.     [employer], Medical Student [facility]     RESIDENT ATTESTATION OF STUDENT NOTE   I attest that I have reviewed the student note and that the components of the history of the present illness, the physical exam, and the assessment and plan documented were performed  by me or were performed in my presence by the student where I verified the documentation and performed (or re-performed) the exam and medical decision making. I verify that the service and findings are accurately documented in the student's note.  Shirlean Mylar, MD PGY-2, [facility] 07/08/2020 9:17 PM

## 2020-07-06 NOTE — Assessment & Plan Note (Addendum)
Patient has had continuing pain in R hand upon usage at work. Recommended getting a brace for the thumb to prevent it from popping out of socket. Counseled on finding a brace at [employer], [facility], etc. Also referred to hand surgeon and told to expect a phone call to schedule that appointment in 2 weeks.

## 2020-07-31 ENCOUNTER — Other Ambulatory Visit: Payer: Self-pay

## 2020-07-31 ENCOUNTER — Ambulatory Visit (INDEPENDENT_AMBULATORY_CARE_PROVIDER_SITE_OTHER): Payer: Medicaid Other | Admitting: Family Medicine

## 2020-07-31 DIAGNOSIS — M79604 Pain in right leg: Secondary | ICD-10-CM | POA: Diagnosis not present

## 2020-07-31 DIAGNOSIS — A048 Other specified bacterial intestinal infections: Secondary | ICD-10-CM

## 2020-07-31 NOTE — Progress Notes (Signed)
    SUBJECTIVE:   CHIEF COMPLAINT / HPI:  Unsure why he is here  Recently treated for H. pylori 2 weeks ago.  Completed medications 5 days ago.  Denies any abdominal pain today.  Reports intermittent abdominal pain after spicy meals.    Right lower leg pain Reports right lower leg pain that started 4 days ago.  He is not having any pain now.  Pain is located on the mid anterior aspect of the right lower extremity.  Cannot recall any trauma.  Denies any numbness, tingling or swelling of the extremity.  Denies any incontinence of bowel or bladder.  PERTINENT  PMH / PSH:  H. Pylori Thrombocytopenia  OBJECTIVE:   BP 116/76   Pulse 94   Ht 6\' 2"  (1.88 m)   Wt 198 lb 3.2 oz (89.9 kg)   SpO2 98%   BMI 25.45 kg/m    General: Alert and oriented, no apparent distress  Eyes: PEERLA ENTM: No pharyengeal erythema Neck: nontender Cardiovascular: RRR with no murmurs noted Respiratory: CTA bilaterally  Gastrointestinal: Bowel sounds present. No abdominal pain MSK: Upper extremity strength 5/5 bilaterally, Lower extremity strength 5/5 bilaterally , no obvious deformities noted in either extremity.  No lower extremity edema.  Well perfused and pulses present bilaterally Derm: No rashes noted Neuro: CNIII-XII intact, motor, sensory and gait intact. Psych: Behavior and speech appropriate to situation  ASSESSMENT/PLAN:   H. pylori infection No abdominal pain.  Completed triple therapy 4 to 5 days ago. Has an appointment scheduled for December 17 with Dr. December 19 for follow-up. -Repeat H. pylori testing at that time.  Right leg pain Not currently having any pain today.  Unclear etiology as patient denies any recent trauma.  No focal neurologic deficits and no red flags.  Less likely DVT given no edema. -We will continue to monitor -If symptoms worsen can consider PT -Tylenol as needed -Continue to mobilize extremity -Can use heating pads if needed -Follow-up as needed     Manson Passey,  MD [facility]

## 2020-07-31 NOTE — Patient Instructions (Addendum)
Thank you for coming to see me today. It was a pleasure. Today we talked about:   Follow up with Dr Manson Passey on Dec 17th at 910 am for [facility] testing.  Please follow-up with me in 4 weeks.  If you have any questions or concerns, please do not hesitate to call the office at [phone].  Best,   Dana Allan, MD Family Medicine Residency

## 2020-08-04 ENCOUNTER — Encounter: Payer: Self-pay | Admitting: Family Medicine

## 2020-08-04 DIAGNOSIS — A048 Other specified bacterial intestinal infections: Secondary | ICD-10-CM | POA: Insufficient documentation

## 2020-08-04 DIAGNOSIS — M79604 Pain in right leg: Secondary | ICD-10-CM | POA: Insufficient documentation

## 2020-08-04 NOTE — Assessment & Plan Note (Signed)
Not currently having any pain today.  Unclear etiology as patient denies any recent trauma.  No focal neurologic deficits and no red flags.  Less likely DVT given no edema. -We will continue to monitor -If symptoms worsen can consider PT -Tylenol as needed -Continue to mobilize extremity -Can use heating pads if needed -Follow-up as needed

## 2020-08-04 NOTE — Assessment & Plan Note (Signed)
No abdominal pain.  Completed triple therapy 4 to 5 days ago. Has an appointment scheduled for December 17 with Dr. Manson Passey for follow-up. -Repeat H. pylori testing at that time.

## 2020-08-10 ENCOUNTER — Ambulatory Visit: Payer: Medicaid Other | Admitting: Family Medicine

## 2020-08-28 ENCOUNTER — Ambulatory Visit: Payer: Medicaid Other | Admitting: Family Medicine

## 2020-08-28 NOTE — Progress Notes (Deleted)
    SUBJECTIVE:   CHIEF COMPLAINT / HPI:   Presents for follow up for HPyloritesting  Seen in clinic on ** and treated with Triple therapy.  Since then patient reports improvement in symptoms **. Associated symptoms include **.   PERTINENT  PMH / PSH: ***  OBJECTIVE:   There were no vitals taken for this visit.   General: Alert, no acute distress Cardio: Normal S1 and S2, RRR, no r/m/g Pulm: CTAB, normal work of breathing Abdomen: Bowel sounds normal. Abdomen soft and non-tender.  Extremities: No peripheral edema.  Neuro: Cranial nerves grossly intact   ASSESSMENT/PLAN:   No problem-specific Assessment & Plan notes found for this encounter.     Dana Allan, MD [facility]

## 2020-08-28 NOTE — Patient Instructions (Incomplete)
Thank you for coming to see me today. It was a pleasure.  We will get some labs today.  If they are abnormal or we need to do something about them, I will call you.  If they are normal, I will send you a message on MyChart (if it is active) or a letter in the mail.  If you don't hear from [place] in 2 weeks, please call the office at the number below.  Please follow-up with me in as needed  If you have any questions or concerns, please do not hesitate to call the office at [phone].  Best,   Dana Allan, MD  Family Medicine [facility]

## 2020-09-27 ENCOUNTER — Encounter: Payer: Self-pay | Admitting: Family Medicine

## 2020-09-27 ENCOUNTER — Ambulatory Visit: Payer: Medicaid Other | Admitting: Family Medicine

## 2020-09-27 ENCOUNTER — Other Ambulatory Visit: Payer: Self-pay

## 2020-09-27 VITALS — BP 120/66 | HR 79 | Ht 74.0 in | Wt 193.0 lb

## 2020-09-27 DIAGNOSIS — H11001 Unspecified pterygium of right eye: Secondary | ICD-10-CM | POA: Diagnosis present

## 2020-09-27 NOTE — Progress Notes (Signed)
    SUBJECTIVE:   CHIEF COMPLAINT / HPI:   Right eye complaint  1 week hx of right eye burning and itching but sometimes in the left too. Feels like there is something in there. Denies discharge/fluid leakage. Occupation: drives fork lift. Denies trauma, vision changes or headaches.   PERTINENT  PMH / PSH: H. Pylori.  OBJECTIVE:   BP 120/66   Pulse 79   Ht 6\' 2"  (1.88 m)   Wt 193 lb (87.5 kg)   SpO2 97%   BMI 24.78 kg/m    General: Alert, no acute distress Eye: triangular cream coloured wedge over inner conjunctiva of right eye, conjunctival erythema, no discharge or fluid leakage Cardio: Normal S1 and S2, RRR, no r/m/g Pulm: CTAB, normal work of breathing Abdomen: Bowel sounds normal. Abdomen soft and non-tender.  Extremities: No peripheral edema.  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 Visual acuity normal. Also considered FB in eye however less likely due to examination findings today. Fluoroscopy not indicated. Referred to opthalmology for further treatment. Counseled pt to wear sunglasses daily for protection of his eyes. Follow up with PCP.     [place], MD PGY-2 [facility]

## 2020-09-27 NOTE — Patient Instructions (Signed)
Thank you for coming to see me today. It was a pleasure. Today we discussed your right eye problem. It is most likely a growth of tissue in the right eye which is causing the symptoms. I am referring you to ophthalmology, they may want to do surgery.  To prevent this in the other eye please wear sunglasses daily.   Please follow-up with your doctor.  If you have any questions or concerns, please do not hesitate to call the office at [phone].  Best wishes,   Dr Allena Katz

## 2020-09-28 DIAGNOSIS — H11001 Unspecified pterygium of right eye: Secondary | ICD-10-CM | POA: Insufficient documentation

## 2020-09-28 NOTE — Assessment & Plan Note (Addendum)
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 Visual acuity normal. Also considered FB in eye however less likely due to examination findings today. Fluoroscopy not indicated. Referred to opthalmology for further treatment. Counseled pt to wear sunglasses daily for protection of his eyes. Follow up with PCP.

## 2020-10-15 ENCOUNTER — Telehealth: Payer: Self-pay

## 2020-10-15 NOTE — Telephone Encounter (Signed)
Pt left a message on Saturday 10/13/20 stating he wanted to make an appt with his PCP or Eye Care provider. I called the pt back and advised he left this message with the [facility] and he indicated he left this message in error and would call the correct place.

## 2020-11-27 ENCOUNTER — Ambulatory Visit: Payer: Medicaid Other

## 2021-03-19 NOTE — Progress Notes (Signed)
    SUBJECTIVE:   CHIEF COMPLAINT / HPI:   Headache Onset: 2-3 days Location: occipital Quality: throbbing Frequency: intermittent Precipitating factors: decrease in water intake. EtOh Sat and Sun 'not much' Prior treatment: Tylenol with relief  Associated Symptoms Nausea/vomiting: no  Photophobia/phonophobia: no  Tearing of eyes: no  Sinus pain/pressure: no  Family hx migraine: no  Personal stressors: no  Relation to menstrual cycle: N/A   Red Flags Fever: no  Neck pain/stiffness: no  Vision/speech/swallow/hearing difficulty: no  Focal weakness/numbness: no  Altered mental status: no  Trauma: no  New type of headache: no  Anticoagulant use: no  H/o cancer/HIV/Pregnancy: no    PERTINENT  PMH / PSH:  EtOH Thrombocytopenia  OBJECTIVE:   BP 122/68   Pulse 70   Ht 6\' 2"  (1.88 m)   Wt 210 lb (95.3 kg)   SpO2 98%   BMI 26.96 kg/m    General: Alert, no acute distress Cardio: Normal S1 and S2, RRR, no r/m/g Pulm: CTAB, normal work of breathing Neuro: CN II: PERRL CN III, IV,VI: EOMI CV V: Normal sensation in V1, V2, V3 CVII: Symmetric smile and brow raise CN VIII: Normal hearing CN IX,X: Symmetric palate raise  CN XI: 5/5 shoulder shrug CN XII: Symmetric tongue protrusion  UE and LE strength 5/5 2+ UE and LE reflexes  Normal sensation in UE and LE bilaterally  No ataxia with finger to nose, normal heel to shin  Negative Rhomberg     ASSESSMENT/PLAN:   Headache No neurologic deficits on exam.  Relieved with tylenol. Likely tension headache -Sleep hygiene -Limit screen time -Adequate water intake -Tylenol as needed -Avoid EtOH -Follow up with PCP  if symptoms worsen   Pterygium eye, right Ophthalmology appointment card with date and time given to patient.     , MD [facility]

## 2021-03-19 NOTE — Patient Instructions (Signed)
Thank you for coming to see me today. It was a pleasure. Today we talked about:   What can I do to improve my insomnia? -- You can follow good "sleep hygiene." That means that you: ?Sleep only long enough to feel rested and then get out of bed ?Go to bed and get up at the same time every day ?Do not try to force yourself to sleep. If you can't sleep, get out of bed and try again later. ?Have coffee, tea, and other foods that have caffeine only in the morning ?Avoid alcohol in the late afternoon, evening, and bedtime ?Avoid smoking, especially in the evening ?Keep your bedroom dark, cool, quiet, and free of reminders of work or other things that cause you stress ?Solve problems you have before you go to bed ?Exercise several days a week, but not right before bed ?Avoid looking at phones or reading devices ("e-books") that give off light before bed. This can make it harder to fall asleep. Other things that can improve sleep include: ?Relaxation therapy, in which you focus on relaxing all the muscles in your body 1 by 1 ?Working with a Conservator, museum/gallery to deal with the problems that might be causing poor sleep  Sleep hygiene guidelines Recommendation Details  Regular bedtime and rise time Having a consistent bedtime and rise time leads to more regular sleep schedules and avoids periods of sleep deprivation or periods of extended wakefulness during the night.  Avoid napping Avoid napping, especially naps lasting longer than 1 hour and naps late in the day.  Limit caffeine Avoid caffeine after lunch. The time between lunch and bedtime represents approximately 2 half-lives for caffeine, and this time window allows for most caffeine to be metabolized before bedtime.  Limit alcohol Recommendations are typically focused on avoiding alcohol near bedtime. Alcohol is initially sedating, but activating as it is metabolized. Alcohol also negatively impacts sleep architecture.  Avoid nicotine  Nicotine is a stimulant and should be avoided near bedtime and at night.  Exercise Daytime physical activity is encouraged, in particular, 4 to 6 hours before bedtime, as this may facilitate sleep onset. Rigorous exercise within 2 hours of bedtime is discouraged.  Keep the sleep environment quiet and dark Noise and light exposure during the night can disrupt sleep. White noise or ear plugs are often recommended to reduce noise. Using blackout shades or an eye mask is commonly recommended to reduce light. This may also include avoiding exposure to television or technology near bedtime, as this can have an impact on circadian rhythms by shifting sleep timing later.   Bedroom clock Avoid checking the time at night. This includes alarm clocks and other time pieces (eg, watches and smart phones). Checking the time increases cognitive arousal and prolongs wakefulness.  Evening eating Avoid a large meal near bedtime, but don't go to bed hungry. Eat a healthy and filling meal in the evening and avoid late-night snacks.     Please follow-up with PCP in 3 months  If you have any questions or concerns, please do not hesitate to call the office at [phone].  Best,   Dana Allan, MD

## 2021-03-20 ENCOUNTER — Ambulatory Visit (INDEPENDENT_AMBULATORY_CARE_PROVIDER_SITE_OTHER): Payer: Self-pay | Admitting: Family Medicine

## 2021-03-20 ENCOUNTER — Other Ambulatory Visit: Payer: Self-pay

## 2021-03-20 ENCOUNTER — Encounter: Payer: Self-pay | Admitting: Family Medicine

## 2021-03-20 VITALS — BP 122/68 | HR 70 | Ht 74.0 in | Wt 210.0 lb

## 2021-03-20 DIAGNOSIS — Z0289 Encounter for other administrative examinations: Secondary | ICD-10-CM

## 2021-03-20 DIAGNOSIS — R519 Headache, unspecified: Secondary | ICD-10-CM

## 2021-03-20 DIAGNOSIS — Z139 Encounter for screening, unspecified: Secondary | ICD-10-CM

## 2021-03-20 DIAGNOSIS — H11001 Unspecified pterygium of right eye: Secondary | ICD-10-CM

## 2021-03-21 ENCOUNTER — Telehealth: Payer: Self-pay | Admitting: *Deleted

## 2021-03-21 NOTE — Chronic Care Management (AMB) (Signed)
  Care Management   Outreach Note  03/21/2021 Name: Joshua Rush MRN: [id number] DOB: [birth year]  Referred by: Dana Allan, MD Reason for referral : Care Coordination (Initial outreach to schedule referral RNCM )   An unsuccessful telephone outreach was attempted today. The patient was referred to the case management team for assistance with care management and care coordination.   Follow Up Plan: A HIPAA compliant phone message was left for the patient providing contact information and requesting a return call. The care management team will reach out to the patient again over the next 7 days.  If patient returns call to provider office, please advise to call Embedded Care Management Care Guide Gwenevere Ghazi at [phone].  Gwenevere Ghazi  Care Guide, Embedded Care Coordination [facility] Management  Direct Dial: [phone]

## 2021-03-21 NOTE — Chronic Care Management (AMB) (Signed)
  Care Management   Note  03/21/2021 Name: Treyon Wymore MRN: [id number] DOB: Jan 08, 1996  Payam Gribble is a 25 y.o. year old male who is a primary care patient of Dana Allan, MD. I reached out to [employer] by phone today in response to a referral sent by Mr. Chauncey Fischer PCP, Dr. Clent Ridges..    Mr. Knoth was given information about care management services today including:  Care management services include personalized support from designated clinical staff supervised by his physician, including individualized plan of care and coordination with other care providers 24/7 contact phone numbers for assistance for urgent and routine care needs. The patient may stop care management services at any time by phone call to the office staff.  Patient agreed to services and verbal consent obtained.   Follow up plan: Telephone appointment with care management team member scheduled for:03/26/21  [facility] Guide, Embedded Care Coordination [facility]  Care Management  Direct Dial: [phone]

## 2021-03-22 ENCOUNTER — Telehealth: Payer: Self-pay | Admitting: *Deleted

## 2021-03-22 NOTE — Telephone Encounter (Signed)
   Telephone encounter was:  Unsuccessful.  03/22/2021 Name: Joshua Rush MRN: [id number] DOB: 01-14-1996  Unsuccessful outbound call made today to assist with:   not sure of need  Outreach Attempt:  1st Attempt  A HIPAA compliant voice message was left requesting a return call.  Instructed patient to call back at [phone]   [facility] Guide , Embedded Care Coordination [facility], Care Management  [phone] [address] [place] , [place] [address] Email : [email or web address]

## 2021-03-24 ENCOUNTER — Encounter: Payer: Self-pay | Admitting: Family Medicine

## 2021-03-24 DIAGNOSIS — R519 Headache, unspecified: Secondary | ICD-10-CM

## 2021-03-24 HISTORY — DX: Headache, unspecified: R51.9

## 2021-03-24 NOTE — Assessment & Plan Note (Signed)
Ophthalmology appointment card with date and time given to patient.

## 2021-03-24 NOTE — Assessment & Plan Note (Signed)
No neurologic deficits on exam.  Relieved with tylenol. Likely tension headache -Sleep hygiene -Limit screen time -Adequate water intake -Tylenol as needed -Avoid EtOH -Follow up with PCP  if symptoms worsen

## 2021-03-26 ENCOUNTER — Ambulatory Visit: Payer: Self-pay

## 2021-03-26 DIAGNOSIS — Z139 Encounter for screening, unspecified: Secondary | ICD-10-CM

## 2021-03-26 NOTE — Chronic Care Management (AMB) (Signed)
   Care Management    RN Visit Note  03/26/2021 Name: Colm Lyford MRN: [id number] DOB: October 22, 1995  Subjective: Williams Dietrick is a 25 y.o. year old male who is a primary care patient of Dana Allan, MD. The care management team was consulted for assistance with disease management and care coordination needs.    Engaged with patient by telephone for initial visit in response to provider referral for case management and/or care coordination services.   Consent to Services:   Mr. Liaw was given information about Care Management services today including:  Care Management services includes personalized support from designated clinical staff supervised by his physician, including individualized plan of care and coordination with other care providers 24/7 contact phone numbers for assistance for urgent and routine care needs. The patient may stop case management services at any time by phone call to the office staff.  Patient agreed to services and consent obtained.    Assessment: Complete no clinical needs identified. Patient states that he lives in the home with his mother, younger brothers and sister.  He and his sister are the only ones that work.  His sister is currently sick and unable to help.  He states that he has a truck from his job to pick him up but he does not have any other transportation for his health care.  They need assistance to help with their rent, utilities and food.  He states that he has Medicaid for his insurance.  I advised him that I will send a referral to the Care guides and someone will be getting in touch with him.  Recommendation: RNCM will refer the patient to the care guides for SDOH  Follow up Plan: RNCM will remain available for 30 days.  If no further needs are assessed at this time RNCM will be removed from care team.  Review of patient past medical history, allergies, medications, health status, including review of consultants reports, laboratory and  other test data, was performed as part of comprehensive evaluation and provision of chronic care management services.   SDOH (Social Determinants of Health) assessments and interventions performed:  Yes see LPOC  Juanell Fairly RN, BSN, [facility] Care Management Coordinator [facility] Phone: [phone] I Fax: [phone]

## 2021-04-01 ENCOUNTER — Telehealth: Payer: Self-pay

## 2021-04-01 NOTE — Telephone Encounter (Signed)
   Telephone encounter was:  Unsuccessful.  04/01/2021 Name: Joshua Rush MRN: [id number] DOB: [birth year]  Unsuccessful outbound call made today to assist with:  Transportation Needs , Food Insecurity, and utilities.  Outreach Attempt:  1st Attempt  A HIPAA compliant voice message was left requesting a return call.  Instructed patient to call back at [phone].  Donda Friedli, AAS Paralegal, [facility] Care Guide  Embedded Care Coordination [place]  Care Management  [address] [place], [place] [address] ??[email or web address]  ?? [phone]   [email or web address]

## 2021-04-05 ENCOUNTER — Telehealth: Payer: Self-pay

## 2021-04-05 NOTE — Telephone Encounter (Signed)
   Telephone encounter was:  Successful.  04/05/2021 Name: Joshua Rush MRN: [id number] DOB: 1995/11/06  Joshua Rush is a 25 y.o. year old male who is a primary care patient of Dana Allan, MD . The community resource team was consulted for assistance with Transportation Needs , Food Insecurity, and utilities, rent.  Care guide performed the following interventions: Spoke with patient confirmed email address [email or web address]. Emailed resources for transportation, utilities, rent and food.  Follow Up Plan:  Care guide will follow up with patient by phone over the next 7 days.  Marchelle Rinella, AAS Paralegal, [facility] Care Guide  Embedded Care Coordination [place]  Care Management  [address] [place], [place] [address] ??[email or web address]  ?? [phone]   [email or web address]

## 2021-04-09 ENCOUNTER — Telehealth: Payer: Self-pay

## 2021-04-09 NOTE — Telephone Encounter (Signed)
   Telephone encounter was:  Unsuccessful.  04/09/2021 Name: Joshua Rush MRN: [id number] DOB: 1996-01-05  Unsuccessful outbound call made today to assist with:  Left message on voicemail for patient to return my call regarding resources emailed on 04/05/21 for utilities, food and transportation.  Outreach Attempt:  3rd Attempt.  Referral closed unable to contact patient.  A HIPAA compliant voice message was left requesting a return call.  Instructed patient to call back at [phone].  Zaryan Yakubov, AAS Paralegal, [facility] Care Guide  Embedded Care Coordination [place]  Care Management  [address] [place], [place] [address] ??[email or web address]  ?? [phone]   [email or web address]

## 2021-07-28 ENCOUNTER — Encounter (HOSPITAL_COMMUNITY): Payer: Self-pay | Admitting: Emergency Medicine

## 2021-07-28 ENCOUNTER — Other Ambulatory Visit: Payer: Self-pay

## 2021-07-28 ENCOUNTER — Emergency Department (HOSPITAL_COMMUNITY)
Admission: EM | Admit: 2021-07-28 | Discharge: 2021-07-29 | Disposition: A | Discharge status: Home / Self Care | Payer: No Typology Code available for payment source | Attending: Emergency Medicine | Admitting: Emergency Medicine

## 2021-07-28 DIAGNOSIS — Y92410 Unspecified street and highway as the place of occurrence of the external cause: Secondary | ICD-10-CM | POA: Diagnosis not present

## 2021-07-28 DIAGNOSIS — Z23 Encounter for immunization: Secondary | ICD-10-CM | POA: Insufficient documentation

## 2021-07-28 DIAGNOSIS — Z87891 Personal history of nicotine dependence: Secondary | ICD-10-CM | POA: Insufficient documentation

## 2021-07-28 DIAGNOSIS — S0081XA Abrasion of other part of head, initial encounter: Secondary | ICD-10-CM | POA: Diagnosis not present

## 2021-07-28 DIAGNOSIS — S0990XA Unspecified injury of head, initial encounter: Secondary | ICD-10-CM | POA: Diagnosis present
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
## 2021-07-29 ENCOUNTER — Emergency Department (HOSPITAL_COMMUNITY): Payer: No Typology Code available for payment source

## 2021-07-29 MED ORDER — TETANUS-DIPHTH-ACELL PERTUSSIS 5-2.5-18.5 LF-MCG/0.5 IM SUSY
0.5000 mL | PREFILLED_SYRINGE | Freq: Once | INTRAMUSCULAR | Status: AC
  Administered 2021-07-29: 0.5 mL via INTRAMUSCULAR
  Filled 2021-07-29: qty 0.5

## 2021-07-29 MED ORDER — ACETAMINOPHEN 500 MG PO TABS
1000.0000 mg | ORAL_TABLET | Freq: Once | ORAL | Status: AC
  Administered 2021-07-29: 1000 mg via ORAL
  Filled 2021-07-29: qty 2
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
## 2021-08-03 IMAGING — DX DG FINGER THUMB 2+V*R*
3 series · 3 of 3 positions shown · non-contrast
Comparison: None.

CLINICAL DATA: Pain

EXAM:
RIGHT THUMB 2+V

[dg finger thumb right (1 of 3)]
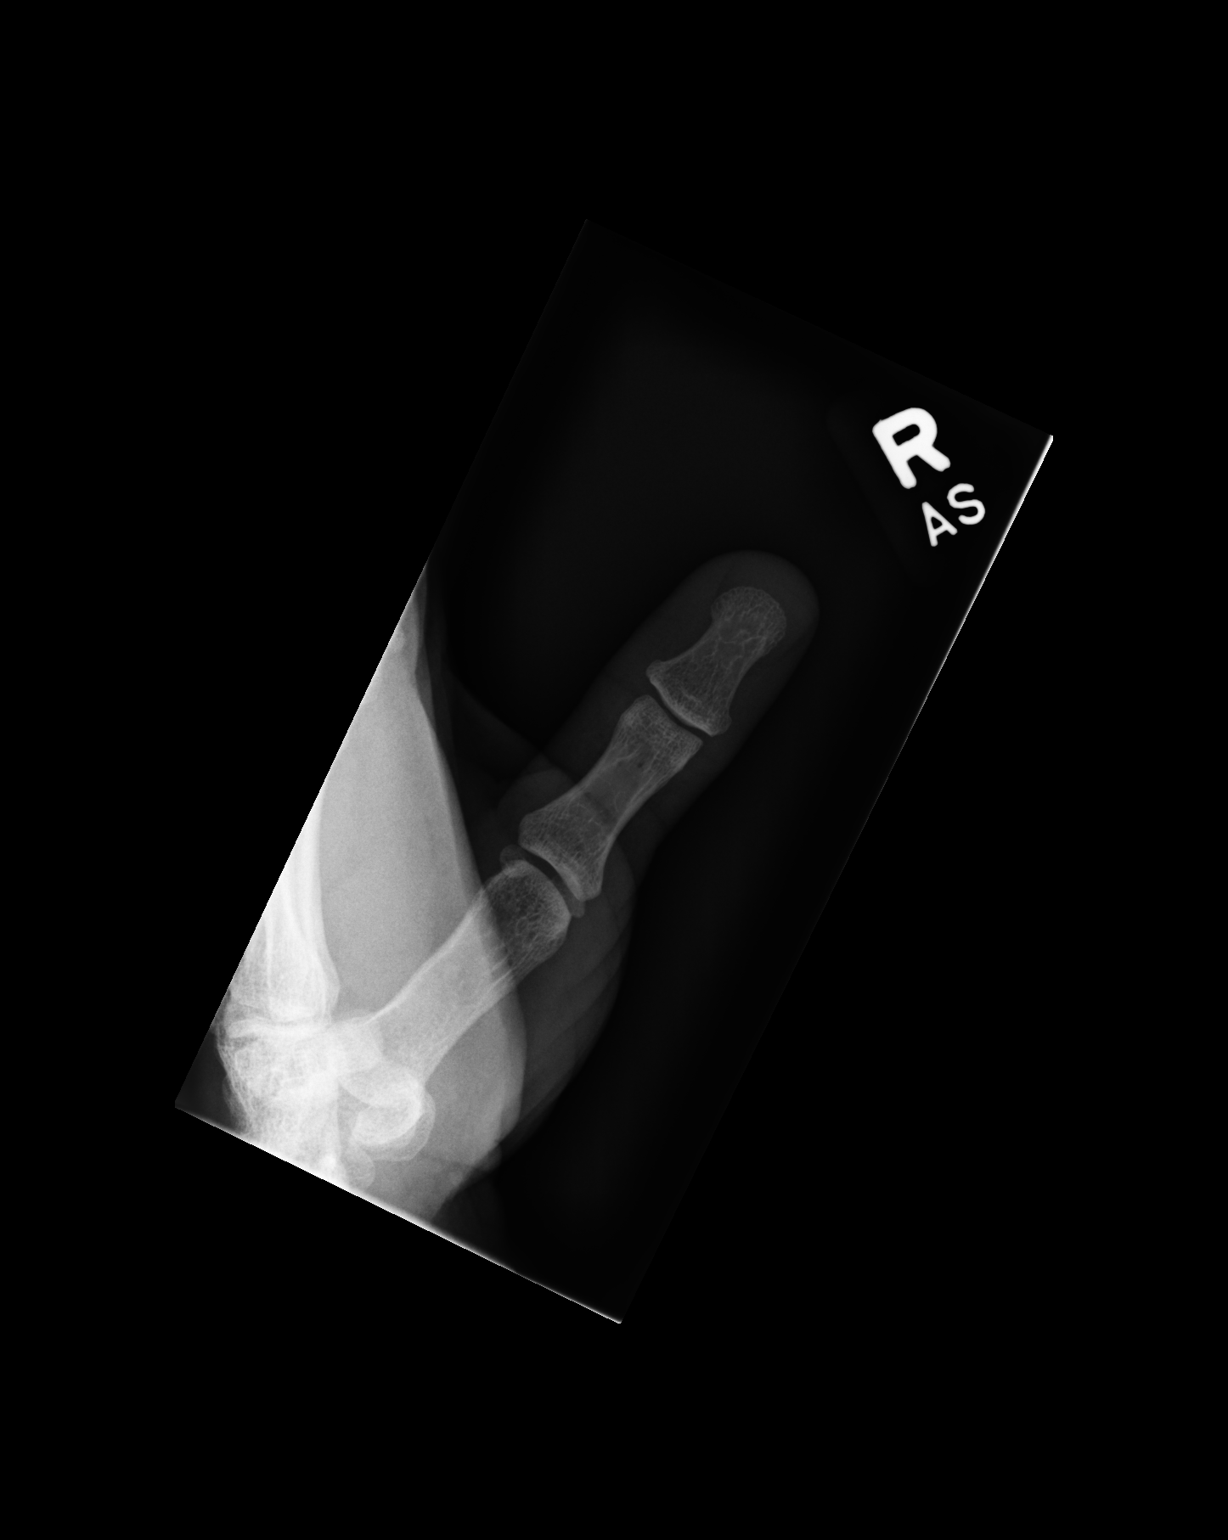

[dg finger thumb right (2 of 3)]
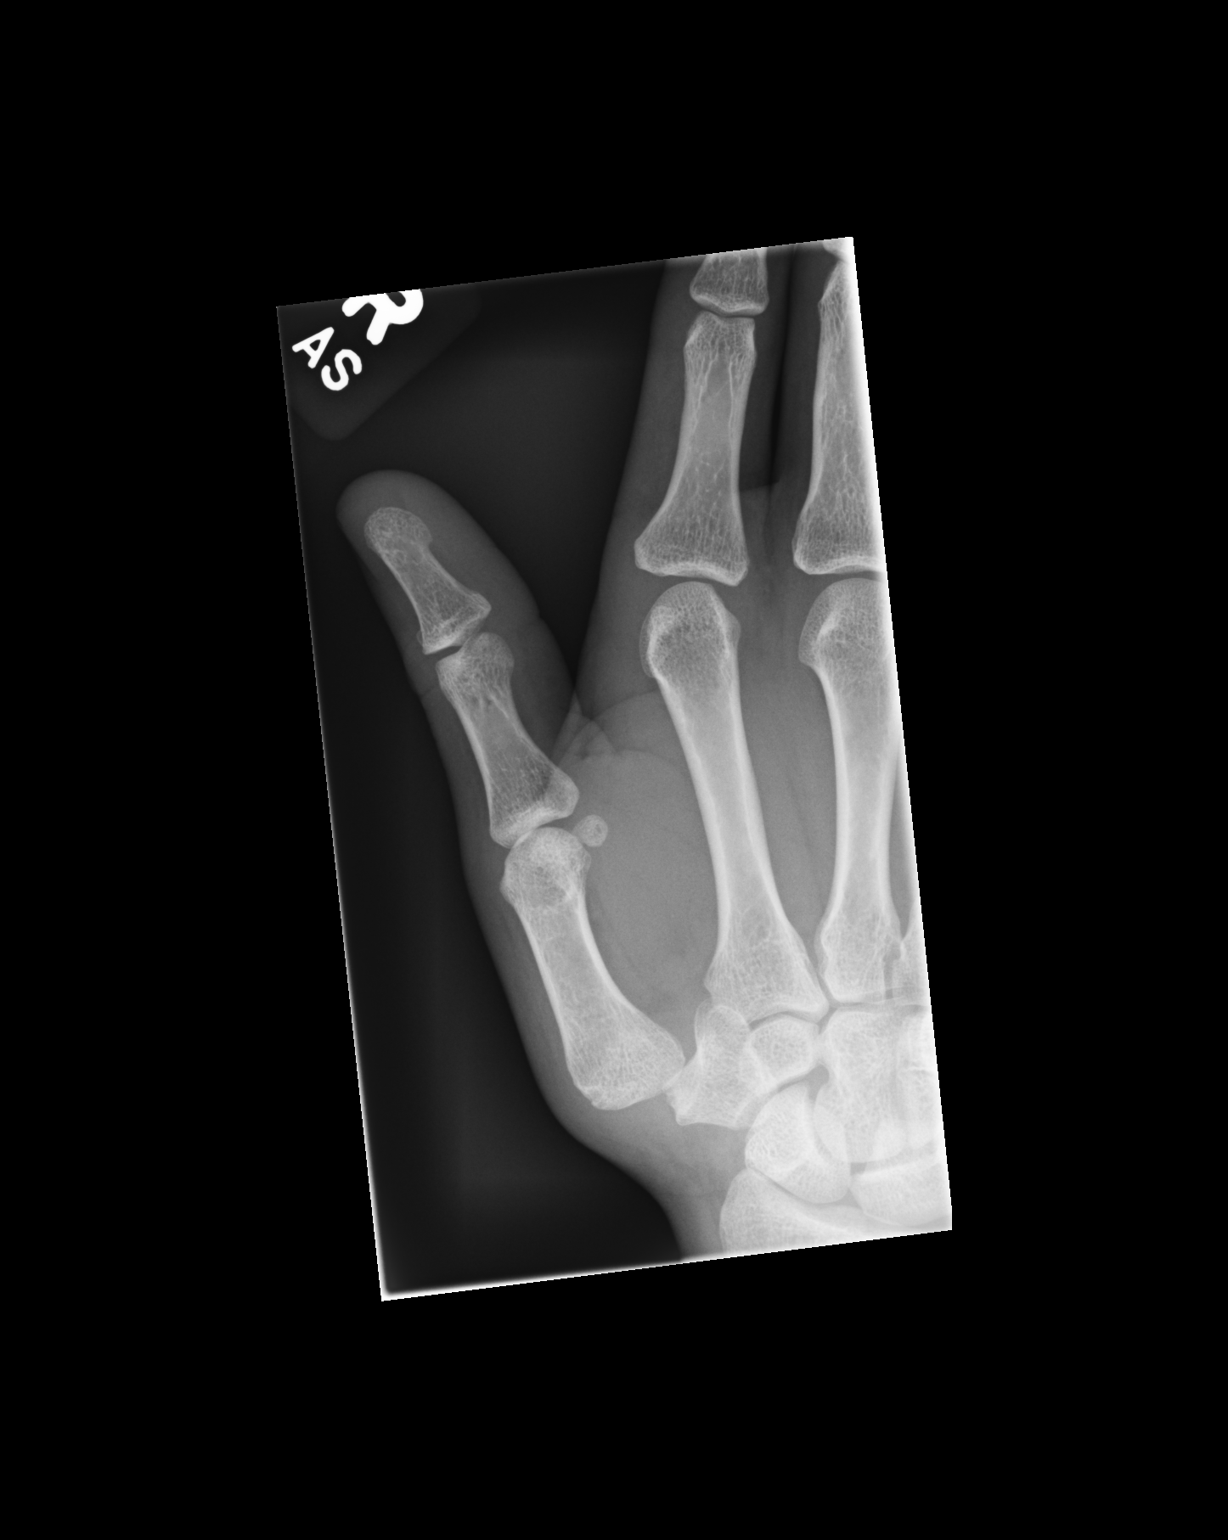

[dg finger thumb right (3 of 3)]
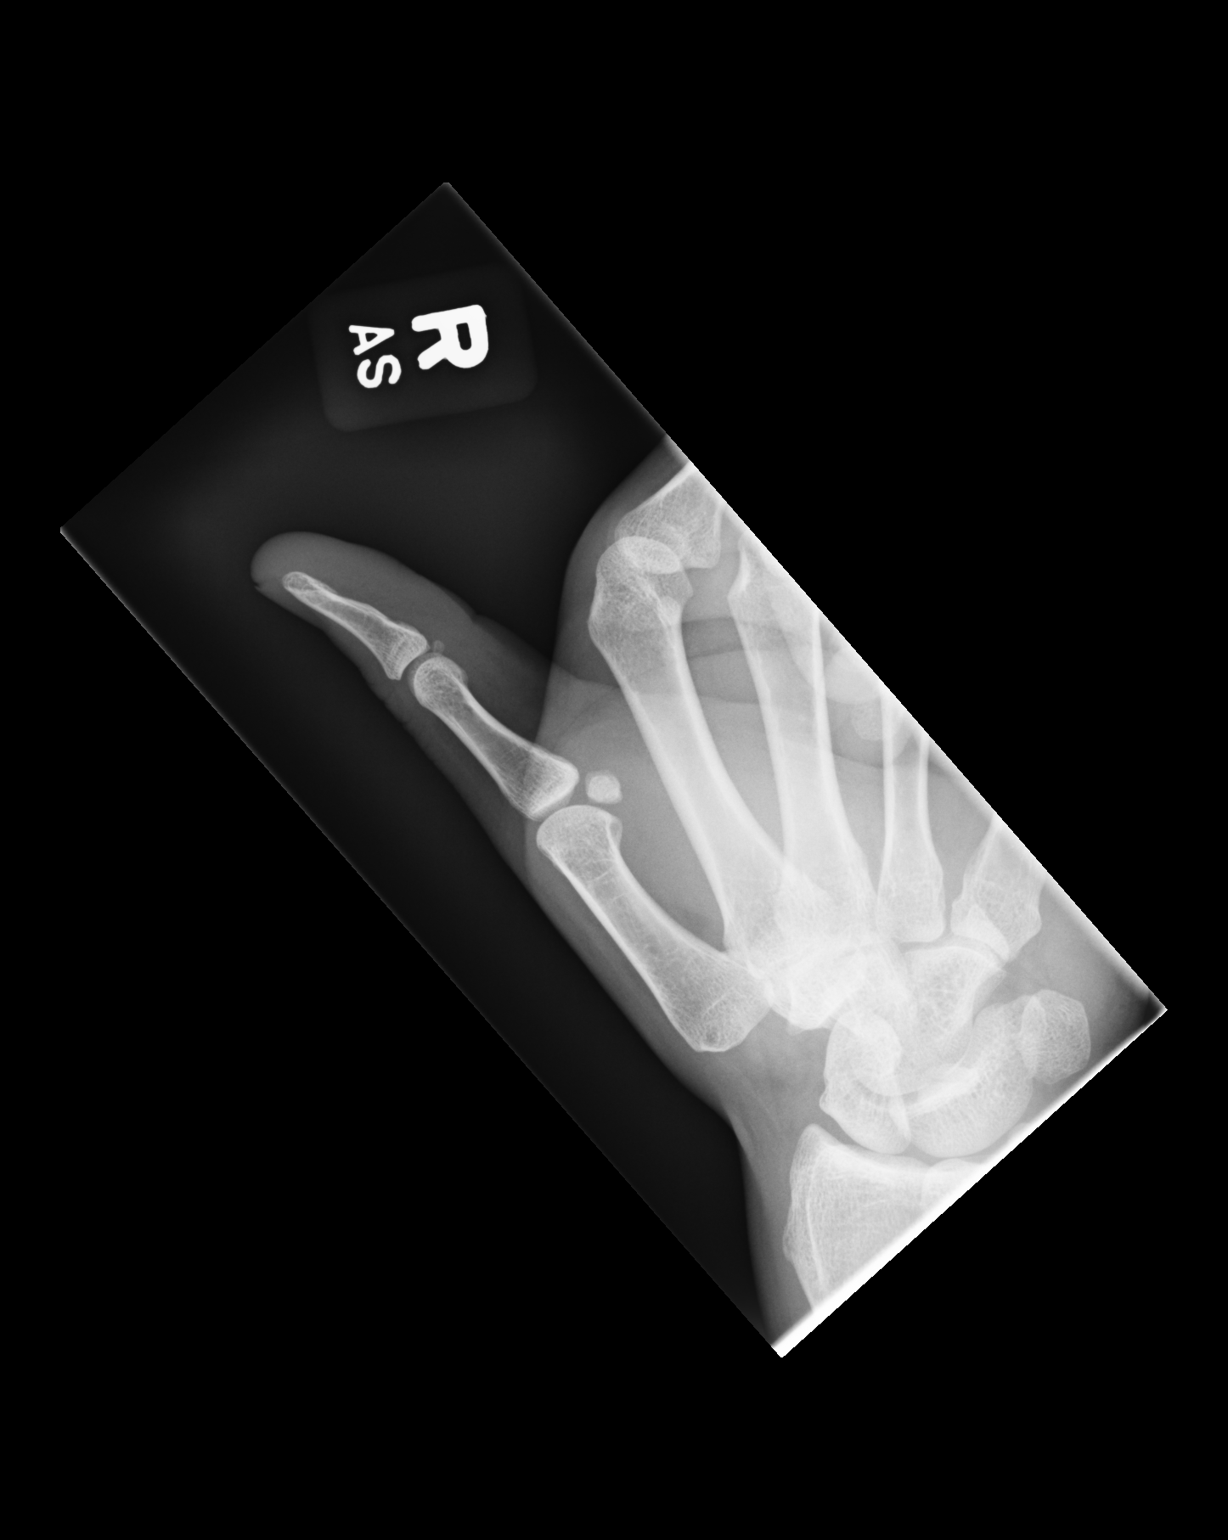

[3 of 3 positions shown; findings below may reference images not displayed]

FINDINGS: There is no acute displaced fracture. No frank dislocation. There
appears to be lateral subluxation of the first digit at the level of
the first carpometacarpal joint. This may be projectional.
IMPRESSION: 1. No acute displaced fracture.
2. Lateral subluxation of the first digit at the level of the first
carpometacarpal joint.
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
## 2022-09-22 DIAGNOSIS — H04123 Dry eye syndrome of bilateral lacrimal glands: Secondary | ICD-10-CM | POA: Diagnosis not present

## 2022-10-10 ENCOUNTER — Encounter (HOSPITAL_COMMUNITY): Payer: Self-pay

## 2022-10-10 ENCOUNTER — Ambulatory Visit (HOSPITAL_COMMUNITY)
Admission: EM | Admit: 2022-10-10 | Discharge: 2022-10-10 | Disposition: A | Discharge status: Home / Self Care | Payer: BC Managed Care – PPO | Attending: Internal Medicine | Admitting: Internal Medicine

## 2022-10-10 DIAGNOSIS — J069 Acute upper respiratory infection, unspecified: Secondary | ICD-10-CM | POA: Diagnosis not present

## 2022-10-10 DIAGNOSIS — Z1152 Encounter for screening for COVID-19: Secondary | ICD-10-CM | POA: Insufficient documentation

## 2022-10-10 MED ORDER — BENZONATATE 100 MG PO CAPS: 100.0000 mg | ORAL_CAPSULE | Freq: Three times a day (TID) | ORAL | 0 refills | Status: DC

## 2022-10-10 MED ORDER — GUAIFENESIN ER 1200 MG PO TB12: 1200.0000 mg | ORAL_TABLET | Freq: Two times a day (BID) | ORAL | 0 refills | Status: DC

## 2022-10-10 NOTE — Discharge Instructions (Signed)
You have a viral upper respiratory infection.  COVID-19 testing is pending. We will call you with results if positive. If your COVID test is positive, you must stay at home until day 6 of symptoms. On day 6, you may go out into public and go back to work, but you must wear a mask until day 11 of symptoms to prevent spread to others.  Use the following medicines to help with symptoms: - Plain Mucinex (guaifenesin) over the counter as directed every 12 hours to thin mucous so that you are able to get it out of your body easier. Drink plenty of water while taking this medication so that it works well in your body (at least 8 cups a day).  - Tylenol 1,075m and/or ibuprofen 6050mevery 6 hours with food as needed for aches/pains or fever/chills.  - Tessalon perles every 8 hours as needed for cough.  1 tablespoon of honey in warm water and/or salt water gargles may also help with symptoms. Humidifier to your room will help add water to the air and reduce coughing.  If you develop any new or worsening symptoms, please return.  If your symptoms are severe, please go to the emergency room.  Follow-up with your primary care provider for further evaluation and management of your symptoms as well as ongoing wellness visits.  I hope you feel better!

## 2022-10-10 NOTE — ED Triage Notes (Signed)
Chief Complaint: cough, fever, chills. Cough is productive and dark.   Onset: 2 days   Prescriptions or OTC medications tried: Yes- day and Nyquil     with no relief  Sick exposure: Yes- brother is sick but no testing done.   New foods, medications, or products: No  Recent Travel: No

## 2022-10-10 NOTE — ED Provider Notes (Signed)
[facility]    CSN: [id number] Arrival date & time: 10/10/22  1628      History   Chief Complaint Chief Complaint  Patient presents with   Cough   Fever    HPI Joshua Rush is a 27 y.o. male.   Patient presents to urgent care for evaluation of cough, nasal congestion, fever/chills, sore throat, and generalized weakness for the last 2 days starting on October 08, 2022.  His brother is sick with similar symptoms but has not been to the provider office to test for COVID-19 or influenza.  Patient tested for COVID-19 at home and it was negative.  No recent COVID-19 infections in the last 90 days reported.  No history of asthma or chronic respiratory problems.  He is a former smoker.  He has not heard any wheezing to his chest and has not had any shortness of breath, chest pain, heart palpitations, nausea, vomiting, dizziness, or vision changes.  No headache, tinnitus, or ear pain.  Cough is productive with yellow sputum.  Nasal congestion makes it difficult for him to breathe through his nose.  Sore throat is worse with swallowing.  No muffled voice sounds or tonsillar swelling.  No recent antibiotics or steroid use.  He has been using DayQuil and NyQuil over-the-counter without relief of symptoms.  NyQuil helps him sleep at night but has not helped with cough.  No recent travel.   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 No pertinent surgical history.     Home Medications    Prior to Admission medications   Medication Sig Start Date End Date Taking? Authorizing Provider  benzonatate (TESSALON) 100 MG capsule Take 1 capsule (100 mg total) by mouth every 8 (eight) hours. 10/10/22  Yes Talbot Grumbling, FNP  Guaifenesin 1200 MG TB12 Take 1 tablet (1,200 mg total) by mouth in the morning and at bedtime. 10/10/22  Yes Talbot Grumbling, FNP  amoxicillin (AMOXIL) 500 MG capsule Take 2 capsules (1,000 mg total) by mouth 2 (two) times daily. 07/06/20   Gladys Damme, MD  clarithromycin (BIAXIN) 500 MG tablet Take 1 tablet (500 mg total) by mouth 2 (two) times daily. 07/06/20   Gladys Damme, MD  famotidine (PEPCID) 20 MG tablet Take 1 tablet (20 mg total) by mouth 2 (two) times daily. 06/12/20   Loura Halt A, NP  omeprazole (PRILOSEC) 40 MG capsule Take 1 capsule (40 mg total) by mouth in the morning and at bedtime. 07/06/20   Gladys Damme, MD  simethicone (GAS-X) 80 MG chewable tablet Chew 1 tablet (80 mg total) by mouth every 6 (six) hours as needed (bloating). 04/20/20 06/12/20  Zigmund Gottron, NP    Family History Family History  Problem Relation Age of Onset   HIV Mother     Social History Social History   Tobacco Use   Smoking status: Former  Smokeless tobacco: Never  Substance Use Topics   Alcohol use: Yes    Comment: intermittently drinks > 5 drinks per day    Drug use: Never     Allergies   Patient has no known allergies.   Review of Systems Review of Systems  Constitutional:  Positive for fever.  Respiratory:  Positive for cough.   Per HPI   Physical Exam Triage Vital Signs ED Triage Vitals  Enc Vitals Group     BP 10/10/22 1736 114/80     Pulse Rate 10/10/22 1736 88     Resp 10/10/22 1736 16     Temp 10/10/22 1736 98.1 F (36.7 C)     Temp Source 10/10/22 1736 Oral     SpO2 10/10/22 1736 98 %     Weight 10/10/22 1735 205 lb (93  kg)     Height 10/10/22 1735 6' 2"$  (1.88 m)     Head Circumference --      Peak Flow --      Pain Score 10/10/22 1734 0     Pain Loc --      Pain Edu? --      Excl. in [place]? --    No data found.  Updated Vital Signs BP 114/80 (BP Location: Left Arm)   Pulse 88   Temp 98.1 F (36.7 C) (Oral)   Resp 16   Ht 6' 2"$  (1.88 m)   Wt 205 lb (93 kg)   SpO2 98%   BMI 26.32 kg/m   Visual Acuity Right Eye Distance:   Left Eye Distance:   Bilateral Distance:    Right Eye Near:   Left Eye Near:    Bilateral Near:     Physical Exam Vitals and nursing note reviewed.  Constitutional:      Appearance: He is not ill-appearing or toxic-appearing.  HENT:     Head: Normocephalic and atraumatic.     Right Ear: Hearing, tympanic membrane, ear canal and external ear normal.     Left Ear: Hearing, tympanic membrane, ear canal and external ear normal.     Nose: Congestion present.     Mouth/Throat:     Lips: Pink.     Mouth: Mucous membranes are moist. No injury.     Tongue: No lesions. Tongue does not deviate from midline.     Palate: No mass and lesions.     Pharynx: Oropharynx is clear. Uvula midline. No pharyngeal swelling, oropharyngeal exudate, posterior oropharyngeal erythema or uvula swelling.     Tonsils: No tonsillar exudate or tonsillar abscesses.  Eyes:     General: Lids are normal. Vision grossly intact. Gaze aligned appropriately.        Right eye: No discharge.        Left eye: No discharge.     Extraocular Movements: Extraocular movements intact.     Conjunctiva/sclera: Conjunctivae normal.  Cardiovascular:     Rate and Rhythm: Normal rate and regular rhythm.     Heart sounds: Normal heart sounds, S1 normal and S2 normal.  Pulmonary:     Effort: Pulmonary effort is normal. No respiratory distress.     Breath sounds: Normal breath sounds and air entry. No wheezing, rhonchi or rales.  Abdominal:     General: Abdomen is flat. Bowel sounds are normal.     Palpations:  Abdomen is soft.  Musculoskeletal:     Cervical back: Neck supple.  Lymphadenopathy:     Cervical: No cervical adenopathy.  Skin:  General: Skin is warm and dry.     Capillary Refill: Capillary refill takes less than 2 seconds.     Findings: No rash.  Neurological:     General: No focal deficit present.     Mental Status: He is alert and oriented to person, place, and time. Mental status is at baseline.     Cranial Nerves: No dysarthria or facial asymmetry.  Psychiatric:        Mood and Affect: Mood normal.        Speech: Speech normal.        Behavior: Behavior normal.        Thought Content: Thought content normal.        Judgment: Judgment normal.      UC Treatments / Results  Labs (all labs ordered are listed, but only abnormal results are displayed) Labs Reviewed  SARS CORONAVIRUS 2 (TAT 6-24 HRS)    EKG   Radiology No results found.  Procedures Procedures (including critical care time)  Medications Ordered in UC Medications - No data to display  Initial Impression / Assessment and Plan / UC Course  I have reviewed the triage vital signs and the nursing notes.  Pertinent labs & imaging results that were available during my care of the patient were reviewed by me and considered in my medical decision making (see chart for details).   1. Viral URI with cough Symptoms and physical exam consistent with a viral upper respiratory tract infection that will likely resolve with rest, fluids, and prescriptions for symptomatic relief. Deferred imaging based on stable cardiopulmonary exam and hemodynamically stable vital signs.  COVID-19 testing is pending.  We will call patient if this is positive.  Quarantine guidelines discussed. Currently on day 3 of symptoms and does not qualify for antiviral therapy.   Tessalon Perles sent to pharmacy for symptomatic relief to be taken as prescribed.  May continue taking over the counter medications as directed for further  symptomatic relief. Nonpharmacologic interventions for symptom relief provided and after visit summary below. Advised to push fluids to stay well hydrated while recovering from viral illness.   Discussed physical exam and available lab work findings in clinic with patient.  Counseled patient regarding appropriate use of medications and potential side effects for all medications recommended or prescribed today. Discussed red flag signs and symptoms of worsening condition,when to call the PCP office, return to urgent care, and when to seek higher level of care in the emergency department. Patient verbalizes understanding and agreement with plan. All questions answered. Patient discharged in stable condition.     Final Clinical Impressions(s) / UC Diagnoses   Final diagnoses:  Viral URI with cough     Discharge Instructions      You have a viral upper respiratory infection.  COVID-19 testing is pending. We will call you with results if positive. If your COVID test is positive, you must stay at home until day 6 of symptoms. On day 6, you may go out into public and go back to work, but you must wear a mask until day 11 of symptoms to prevent spread to others.  Use the following medicines to help with symptoms: - Plain Mucinex (guaifenesin) over the counter as directed every 12 hours to thin mucous so that you are able to get it out of your body easier. Drink plenty of water while taking this medication so that it works well in your body (at least 8 cups a day).  - Tylenol  1,036m and/or ibuprofen 6045mevery 6 hours with food as needed for aches/pains or fever/chills.  - Tessalon perles every 8 hours as needed for cough.  1 tablespoon of honey in warm water and/or salt water gargles may also help with symptoms. Humidifier to your room will help add water to the air and reduce coughing.  If you develop any new or worsening symptoms, please return.  If your symptoms are severe, please go to the  emergency room.  Follow-up with your primary care provider for further evaluation and management of your symptoms as well as ongoing wellness visits.  I hope you feel better!    ED Prescriptions     Medication Sig Dispense Auth. Provider   benzonatate (TESSALON) 100 MG capsule Take 1 capsule (100 mg total) by mouth every 8 (eight) hours. 21 capsule StJoella Prince, FNP   Guaifenesin 1200 MG TB12 Take 1 tablet (1,200 mg total) by mouth in the morning and at bedtime. 14 tablet StTalbot GrumblingFNP      PDMP not reviewed this encounter.   StTalbot GrumblingFNMuscatine2/16/24 1758

## 2022-10-11 LAB — SARS CORONAVIRUS 2 (TAT 6-24 HRS): SARS Coronavirus 2: NEGATIVE

## 2022-11-19 ENCOUNTER — Ambulatory Visit (HOSPITAL_COMMUNITY)
Admission: EM | Admit: 2022-11-19 | Discharge: 2022-11-19 | Disposition: A | Discharge status: Home / Self Care | Payer: BC Managed Care – PPO | Attending: Family Medicine | Admitting: Family Medicine

## 2022-11-19 ENCOUNTER — Encounter (HOSPITAL_COMMUNITY): Payer: Self-pay

## 2022-11-19 ENCOUNTER — Ambulatory Visit (INDEPENDENT_AMBULATORY_CARE_PROVIDER_SITE_OTHER): Payer: BC Managed Care – PPO

## 2022-11-19 DIAGNOSIS — R1013 Epigastric pain: Secondary | ICD-10-CM

## 2022-11-19 DIAGNOSIS — R079 Chest pain, unspecified: Secondary | ICD-10-CM | POA: Diagnosis not present

## 2022-11-19 LAB — CBG MONITORING, ED: Glucose-Capillary: 89 mg/dL (ref 70–99)

## 2022-11-19 MED ORDER — ALUM & MAG HYDROXIDE-SIMETH 200-200-20 MG/5ML PO SUSP
30.0000 mL | Freq: Once | ORAL | Status: AC
Start: 2022-11-19 — End: 2022-11-19
  Administered 2022-11-19: 30 mL via ORAL

## 2022-11-19 MED ORDER — LIDOCAINE VISCOUS HCL 2 % MT SOLN
OROMUCOSAL | Status: AC
  Filled 2022-11-19: qty 15

## 2022-11-19 MED ORDER — LIDOCAINE VISCOUS HCL 2 % MT SOLN
15.0000 mL | Freq: Once | OROMUCOSAL | Status: AC
Start: 2022-11-19 — End: 2022-11-19
  Administered 2022-11-19: 15 mL via OROMUCOSAL

## 2022-11-19 MED ORDER — PANTOPRAZOLE SODIUM 20 MG PO TBEC: 20.0000 mg | DELAYED_RELEASE_TABLET | Freq: Every day | ORAL | 2 refills | Status: DC

## 2022-11-19 MED ORDER — ALUM & MAG HYDROXIDE-SIMETH 200-200-20 MG/5ML PO SUSP
ORAL | Status: AC
  Filled 2022-11-19: qty 30

## 2022-11-19 NOTE — ED Triage Notes (Signed)
Pt is here for pressure in the chest x 2days

## 2022-11-20 NOTE — ED Provider Notes (Signed)
[facility]   [id number] 11/19/22 Arrival Time: [id number]  ASSESSMENT & PLAN:  1. Chest pain, unspecified type   2. Epigastric pain     Patient history and exam consistent with non-cardiac cause of chest pain. Suspect GERD related. Discussed.  ECG: Performed today and interpreted by me: normal EKG, normal sinus rhythm. No STEMI.  I have personally viewed the imaging studies ordered this visit. No acute changes on CXR. No pneumothorax.  CBG WML.  Reports resolution of described pain with GI cocktail. Will start on PPI.  Meds ordered this encounter  Medications   alum & mag hydroxide-simeth (MAALOX/MYLANTA) 200-200-20 MG/5ML suspension 30 mL   lidocaine (XYLOCAINE) 2 % viscous mouth solution 15 mL   pantoprazole (PROTONIX) 20 MG tablet    Sig: Take 1 tablet (20 mg total) by mouth daily.    Dispense:  30 tablet    Refill:  2    Follow-up Information     Schedule an appointment as soon as possible for a visit  with Alen Bleacher, MD.   Specialty: [facility] Contact information: [place] [address] [facility].   Specialty: Urgent Care Why: If worsening or failing to improve as anticipated. Contact information: [place] SSN-[id number] [phone]                Reviewed expectations re: course of current medical issues. Questions answered. Outlined signs and symptoms indicating need for more acute intervention. Patient verbalized understanding. After Visit Summary given.   SUBJECTIVE:  History from: patient. Joshua Rush is a 27 y.o. male who presents with complaint of "pressure" in his chest; around inferior sternum; on/off; past two days; has had h/o similar; sporadic. Denies assoc n/v/SOB/diaphoresis. Questions post-prandial pain at times. Denies back pain and specific abd pain. Afebrile. No recent illnesses. Denies recreational drug  use.  Social History   Tobacco Use  Smoking Status Former  Smokeless Tobacco Never   Social History   Substance and Sexual Activity  Alcohol Use Yes   Comment: intermittently drinks > 5 drinks per day    OBJECTIVE:  Vitals:   11/19/22 1728  BP: 129/77  Pulse: 77  Resp: 12  Temp: 98 F (36.7 C)  TempSrc: Oral  SpO2: 100%    General appearance: alert, oriented, no acute distress Eyes: PERRLA; EOMI; conjunctivae normal HENT: normocephalic; atraumatic Neck: supple with FROM Lungs: without labored respirations; speaks full sentences without difficulty; CTAB Heart: regular rate and rhythm without murmer Chest Wall: without tenderness to palpation Abdomen: soft, non-tender; no guarding or rebound tenderness Extremities: without edema; without calf swelling or tenderness; symmetrical without gross deformities Skin: warm and dry; without rash or lesions Neuro: normal gait Psychological: alert and cooperative; normal mood and affect  Labs: Results for orders placed or performed during the hospital encounter of 11/19/22  POC CBG monitoring  Result Value Ref Range   Glucose-Capillary 89 70 - 99 mg/dL   Labs Reviewed  CBG MONITORING, ED    Imaging: DG Chest 2 View  Result Date: 11/19/2022 CLINICAL DATA:  Chest pain EXAM: CHEST - 2 VIEW COMPARISON:  None Available. FINDINGS: The heart size and mediastinal contours are within normal limits. Both lungs are clear. The visualized skeletal structures are unremarkable. IMPRESSION: No active cardiopulmonary disease. Electronically Signed   By: Elmer Picker M.D.   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[place])    Social History   Socioeconomic History   Marital status: Single    Spouse name: Not on file   Number of children: Not on file   Years of education: Not on file   Highest education level: Not on file  Occupational History    Not on file  Tobacco Use   Smoking status: Former   Smokeless tobacco: Never  Substance and Sexual Activity   Alcohol use: Yes    Comment: intermittently drinks > 5 drinks per day    Drug use: Never   Sexual activity: Yes    Partners: Female  Other Topics Concern   Not on file  Social History Narrative   Mother is Mbiya       Moved here with family of 9       Refugee Information   Number of Immediate Family Members: 5   Number of Immediate Family Members in [place]: 5   Date of Arrival: 04/12/20   Country of Birth: Other   Other Country of Birth:: [place]   Country of Origin: Other   Other Country of Origin:: [place]   Location of Refugee Camp: Other   Other Location of [place]:: [place]   Duration in [place]: 20 years or greater   Reason for [place]: Other   Other Reason for [place]:: [place] in [facility]   Primary Language: English, Other   Other Primary Language:: [place]   Able to Read in Primary Language: Yes   Able to Write in Primary Language: Yes   Education: [employer] (did not graduate)   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)   Marital Status: Single   Sexual Activity: Yes (girlfriend)   Tuberculosis Screening Overseas: Negative   Health Department Labs Completed: Yes   History of Trauma: None   Do You Feel Jumpy or Nervous?: No   Are You Very Watchful or 'Super Alert'?: No   Social Determinants of Health   Financial Resource Strain: High Risk (03/26/2021)   Overall Financial Resource Strain (CARDIA)    Difficulty of Paying Living Expenses: Hard  Food Insecurity: Not on file  Transportation Needs: Unmet Transportation Needs (03/26/2021)   PRAPARE - Hydrologist (Medical): Yes    Lack of Transportation (Non-Medical): No  Physical Activity: Not on file  Stress: Not on file  Social Connections: Not on file  Intimate Partner Violence: Not on file   Family History  Problem Relation Age of  Onset   HIV Mother    History reviewed. No pertinent surgical history.    Vanessa Kick, MD 11/20/22 [phone]

## 2022-12-19 ENCOUNTER — Ambulatory Visit (INDEPENDENT_AMBULATORY_CARE_PROVIDER_SITE_OTHER): Payer: BC Managed Care – PPO | Admitting: Family Medicine

## 2022-12-19 ENCOUNTER — Encounter: Payer: Self-pay | Admitting: Family Medicine

## 2022-12-19 VITALS — BP 110/60 | HR 76 | Ht 74.0 in | Wt 193.0 lb

## 2022-12-19 DIAGNOSIS — Z2839 Other underimmunization status: Secondary | ICD-10-CM | POA: Diagnosis not present

## 2022-12-19 DIAGNOSIS — Z7185 Encounter for immunization safety counseling: Secondary | ICD-10-CM

## 2022-12-19 DIAGNOSIS — R000 Tachycardia, unspecified: Secondary | ICD-10-CM

## 2022-12-19 DIAGNOSIS — O09899 Supervision of other high risk pregnancies, unspecified trimester: Secondary | ICD-10-CM | POA: Diagnosis not present

## 2022-12-19 DIAGNOSIS — D696 Thrombocytopenia, unspecified: Secondary | ICD-10-CM

## 2022-12-19 DIAGNOSIS — Z789 Other specified health status: Secondary | ICD-10-CM

## 2022-12-19 DIAGNOSIS — R748 Abnormal levels of other serum enzymes: Secondary | ICD-10-CM

## 2022-12-19 DIAGNOSIS — R531 Weakness: Secondary | ICD-10-CM | POA: Diagnosis not present

## 2022-12-19 DIAGNOSIS — A048 Other specified bacterial intestinal infections: Secondary | ICD-10-CM

## 2022-12-19 LAB — POCT GLYCOSYLATED HEMOGLOBIN (HGB A1C): Hemoglobin A1C: 4.6 % (ref 4.0–5.6)

## 2022-12-19 NOTE — Patient Instructions (Signed)
It was wonderful to see you today. Thank you for allowing me to be a part of your care. Below is a short summary of what we discussed at your visit today:  Intermittent fast heart rate, dizziness Lab work today. If the results are normal, I will send you a letter or MyChart message. If the results are abnormal, I will give you a call.    We talked about doing some sort of Holter monitor or Zio patch to monitor your heart rhythm for 7 to 10 days.  After discussion, we decided to not do it at this time given you have only had 2 episodes. If you desire this in the future, please let me know and I will order it.   Chest pain, acid reflux I am glad the protonix is helping!   We need to retest you to make sure your old H. Pylori infection is gone.  Please STOP the protonix starting now.  Make an appointment to return in two weeks for a H. Pylori breath test.   BEFORE THE TEST:   - do not eat or drink anything for one hour   - do not chew gum or suck on candies or mints  Health Maintenance We like to think about ways to keep you healthy for years to come. Below are some interventions and screenings we can offer to keep you healthy: - HPV vaccine (available here in clinic) - COVID booster (available here in clinic)   Please bring all of your medications to every appointment!  If you have any questions or concerns, please do not hesitate to contact us via phone or MyChart message.   Fayette Pho, MD

## 2022-12-19 NOTE — Progress Notes (Unsigned)
    SUBJECTIVE:   CHIEF COMPLAINT / HPI:   Concern for pre-diabetes A1c 4.6, average blood sugar 83 Certain foods make him sick Headache, dizzy Fast heart rate Can happen independent of eating Feels weak in knees Mostly fried foods cause this Lab Results  Component Value Date   HGBA1C 4.6 12/19/2022   Lab Results  Component Value Date   LDLCALC 99 05/15/2020   CREATININE 1.16 05/15/2020   Chest pains At work, fast heart rate, up to 125 on smart watch Usually around 80-90 Went to urgent care, dx GERD, improved with protonix Avoiding oily foods now, eating better Now eating vegan keto diet  PERTINENT  PMH / PSH:  Patient Active Problem List   Diagnosis Date Noted   Headache 03/24/2021   Pterygium eye, 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     Fayette Pho, MD [facility]

## 2022-12-22 ENCOUNTER — Encounter: Payer: Self-pay | Admitting: Family Medicine

## 2022-12-22 DIAGNOSIS — R000 Tachycardia, unspecified: Secondary | ICD-10-CM | POA: Insufficient documentation

## 2022-12-22 DIAGNOSIS — Z789 Other specified health status: Secondary | ICD-10-CM

## 2022-12-22 DIAGNOSIS — R531 Weakness: Secondary | ICD-10-CM | POA: Insufficient documentation

## 2022-12-22 DIAGNOSIS — R748 Abnormal levels of other serum enzymes: Secondary | ICD-10-CM | POA: Insufficient documentation

## 2022-12-22 DIAGNOSIS — Z7185 Encounter for immunization safety counseling: Secondary | ICD-10-CM | POA: Insufficient documentation

## 2022-12-22 HISTORY — DX: Other specified health status: Z78.9

## 2022-12-22 LAB — CBC WITH DIFFERENTIAL
Basophils Absolute: 0 10*3/uL (ref 0.0–0.2)
Basos: 1 %
EOS (ABSOLUTE): 0 10*3/uL (ref 0.0–0.4)
Eos: 0 %
Hematocrit: 48.2 % (ref 37.5–51.0)
Hemoglobin: 15.6 g/dL (ref 13.0–17.7)
Immature Grans (Abs): 0 10*3/uL (ref 0.0–0.1)
Immature Granulocytes: 0 %
Lymphocytes Absolute: 1.1 10*3/uL (ref 0.7–3.1)
Lymphs: 30 %
MCH: 27 pg (ref 26.6–33.0)
MCHC: 32.4 g/dL (ref 31.5–35.7)
MCV: 84 fL (ref 79–97)
Monocytes Absolute: 0.3 10*3/uL (ref 0.1–0.9)
Monocytes: 8 %
Neutrophils Absolute: 2.2 10*3/uL (ref 1.4–7.0)
Neutrophils: 61 %
RBC: 5.77 x10E6/uL (ref 4.14–5.80)
RDW: 12.1 % (ref 11.6–15.4)
WBC: 3.6 10*3/uL (ref 3.4–10.8)

## 2022-12-22 LAB — VARICELLA ZOSTER ABS, IGG/IGM
Varicella IgM: <0.91 index (ref 0.00–0.90)
Varicella zoster IgG: 452 index (ref >165)

## 2022-12-22 LAB — TSH: TSH: 1.45 u[IU]/mL (ref 0.450–4.500)

## 2022-12-22 LAB — COMPREHENSIVE METABOLIC PANEL
ALT: 11 IU/L (ref 0–44)
AST: 18 IU/L (ref 0–40)
Albumin/Globulin Ratio: 1.8 (ref 1.2–2.2)
Albumin: 5 g/dL (ref 4.3–5.2)
Alkaline Phosphatase: 99 IU/L (ref 44–121)
BUN/Creatinine Ratio: 8 — ABNORMAL LOW (ref 9–20)
BUN: 8 mg/dL (ref 6–20)
Bilirubin Total: 1.6 mg/dL — ABNORMAL HIGH (ref 0.0–1.2)
CO2: 23 mmol/L (ref 20–29)
Calcium: 9.9 mg/dL (ref 8.7–10.2)
Chloride: 100 mmol/L (ref 96–106)
Creatinine, Ser: 0.98 mg/dL (ref 0.76–1.27)
Globulin, Total: 2.8 g/dL (ref 1.5–4.5)
Glucose: 81 mg/dL (ref 70–99)
Potassium: 4.3 mmol/L (ref 3.5–5.2)
Sodium: 141 mmol/L (ref 134–144)
Total Protein: 7.8 g/dL (ref 6.0–8.5)
eGFR: 110 mL/min/{1.73_m2} (ref >59)

## 2022-12-22 LAB — INSULIN, RANDOM: INSULIN: 6.7 u[IU]/mL (ref 2.6–24.9)

## 2022-12-22 NOTE — Assessment & Plan Note (Signed)
Given prior thrombocytopenia on lab work, will collect associated labs today: CBC with differential, pathology smear review.

## 2022-12-22 NOTE — Assessment & Plan Note (Signed)
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  Patient was reimmunized for varicella after lab work showed nonimmunity.  Today we will collect varicella titers to confirm immunity.

## 2022-12-22 NOTE — Assessment & Plan Note (Signed)
Patient concerned for prediabetes.  A1c here in office 4.6, patient reassured that he does not have diabetes.  Given episodic headache, dizziness, tachycardia, and "feeling weak in the knees" the symptoms resolved with food - in addition to low normal A1c - will add random insulin and TSH to labs today. Patient is fasting at this time.

## 2022-12-22 NOTE — Assessment & Plan Note (Signed)
Two prior episodes of tachycardia, however patient is not experiencing this at this time.  Offered 7-day Zio patch, however patient politely declined stating that it does not happen too frequently.  Will add on CBC to today's blood work.

## 2022-12-22 NOTE — Assessment & Plan Note (Signed)
Previous episode of chest pain and dyspepsia diagnosed at urgent care as GERD, improved with Protonix.  Patient is prior positive H. pylori breath test on file.  He reports that he completed the treatment but does not have a test of cure.  We will have patient stop his acid reducer medication at this time and complete the H. pylori breath test in 2 weeks to ensure resolution.

## 2022-12-22 NOTE — Assessment & Plan Note (Signed)
Previously elevated alkaline phos level on prior labs.  Will collect CMP today with other blood work.

## 2022-12-25 LAB — PATHOLOGIST SMEAR REVIEW
Basophils Absolute: 0 10*3/uL (ref 0.0–0.2)
Basos: 1 %
EOS (ABSOLUTE): 0 10*3/uL (ref 0.0–0.4)
Eos: 0 %
Hematocrit: 49.6 % (ref 37.5–51.0)
Hemoglobin: 15.9 g/dL (ref 13.0–17.7)
Immature Grans (Abs): 0 10*3/uL (ref 0.0–0.1)
Immature Granulocytes: 0 %
Lymphocytes Absolute: 1.1 10*3/uL (ref 0.7–3.1)
Lymphs: 29 %
MCH: 26.8 pg (ref 26.6–33.0)
MCHC: 32.1 g/dL (ref 31.5–35.7)
MCV: 84 fL (ref 79–97)
Monocytes Absolute: 0.3 10*3/uL (ref 0.1–0.9)
Monocytes: 7 %
Neutrophils Absolute: 2.3 10*3/uL (ref 1.4–7.0)
Neutrophils: 63 %
Platelets: 110 10*3/uL — ABNORMAL LOW (ref 150–450)
RBC: 5.94 x10E6/uL — ABNORMAL HIGH (ref 4.14–5.80)
RDW: 12.1 % (ref 11.6–15.4)
WBC: 3.7 10*3/uL (ref 3.4–10.8)

## 2023-01-05 ENCOUNTER — Ambulatory Visit (INDEPENDENT_AMBULATORY_CARE_PROVIDER_SITE_OTHER): Payer: BC Managed Care – PPO | Admitting: Student

## 2023-01-05 ENCOUNTER — Encounter: Payer: Self-pay | Admitting: Student

## 2023-01-05 VITALS — BP 108/64 | HR 92 | Ht 74.0 in | Wt 196.8 lb

## 2023-01-05 DIAGNOSIS — A048 Other specified bacterial intestinal infections: Secondary | ICD-10-CM | POA: Diagnosis not present

## 2023-01-05 NOTE — Patient Instructions (Signed)
It was wonderful to meet you today. Thank you for allowing me to be a part of your care. Below is a short summary of what we discussed at your visit today:  We are obtaining a new H. Pylori today.  After your test is collected you can restart your Protonix daily.  You can also follow-up in 5-6 months for recheck of your platelets and bilirubin.  Please bring all of your medications to every appointment!  If you have any questions or concerns, please do not hesitate to contact us via phone or MyChart message.   Jerre Simon, MD [facility]

## 2023-01-05 NOTE — Progress Notes (Signed)
    SUBJECTIVE:   CHIEF COMPLAINT / HPI:   Patient is a 27 year old male presenting today for H.pylori breath test.  Was recently treated for H. pylori and completed treatments.  Last appointment was advised to return hold Protonix for 2 weeks and for H. pylori breath test. Patient reports he has not taken his Protonix in over 2 weeks. She his abdominal pain has improved and no recent blood in stool.  PERTINENT  PMH / PSH: Reviewed   OBJECTIVE:   BP 108/64   Pulse 92   Ht 6\' 2"  (1.88 m)   Wt 196 lb 12.8 oz (89.3 kg)   SpO2 99%   BMI 25.27 kg/m    Physical Exam General: Alert, well appearing, NAD Cardiovascular: RRR, No Murmurs, Normal S2/S2 Respiratory: CTAB, No wheezing or Rales Abdomen: No distension or tenderness Extremities: No edema on extremities   Skin: Warm and dry  ASSESSMENT/PLAN:   H. pylori infection Follow-up post H. Pylori treatment breath test.  He has been off his Protonix and acid reducing agents for at least 2 weeks. -Obtained pylori breath test. -Advised patient he can resume Protonix after the test is completed.     Jerre Simon, MD [facility]

## 2023-01-05 NOTE — Assessment & Plan Note (Signed)
Follow-up post H. Pylori treatment breath test.  He has been off his Protonix and acid reducing agents for at least 2 weeks. -Obtained pylori breath test. -Advised patient he can resume Protonix after the test is completed.

## 2023-01-07 LAB — H. PYLORI BREATH TEST: H pylori Breath Test: NEGATIVE

## 2023-04-03 ENCOUNTER — Encounter: Payer: Self-pay | Admitting: Family Medicine

## 2023-04-03 ENCOUNTER — Ambulatory Visit (INDEPENDENT_AMBULATORY_CARE_PROVIDER_SITE_OTHER): Payer: BC Managed Care – PPO | Admitting: Family Medicine

## 2023-04-03 VITALS — BP 118/72 | HR 88 | Temp 98.0°F | Wt 206.4 lb

## 2023-04-03 DIAGNOSIS — R509 Fever, unspecified: Secondary | ICD-10-CM

## 2023-04-03 DIAGNOSIS — R232 Flushing: Secondary | ICD-10-CM | POA: Diagnosis not present

## 2023-04-03 DIAGNOSIS — K219 Gastro-esophageal reflux disease without esophagitis: Secondary | ICD-10-CM | POA: Diagnosis not present

## 2023-04-03 LAB — POC SOFIA SARS ANTIGEN FIA: SARS Coronavirus 2 Ag: NEGATIVE

## 2023-04-03 MED ORDER — PANTOPRAZOLE SODIUM 20 MG PO TBEC: 20.0000 mg | DELAYED_RELEASE_TABLET | Freq: Every day | ORAL | 1 refills | Status: DC

## 2023-04-03 NOTE — Assessment & Plan Note (Addendum)
Episodic warmth in abdomen/back, most consistent with worsening reflux/gastritis. Unclear if patient is having true fevers, but appears less likely. Ddx includes GI vs infection. -HIV and QuantGold to r/o secondary etiology -Restart Protonix 20 mg daily, assess for symptom relief at follow-up in 2 weeks with PCP

## 2023-04-03 NOTE — Progress Notes (Signed)
    SUBJECTIVE:   CHIEF COMPLAINT / HPI:   Episodic "fever", burning sensation in back and stomach Patient reports he has episodic burning in his abdomen and back over the past month. Reports it can come on spontaneous last for around 30 minutes to 1 hour and resolves without intervention.  Occurring about 3 times per week. Patient describes this as "fever", reports he does not take his temperature during this time.  Reports sometimes his entire body can feel warm.  [facility], reports everything was negative. H/o H. pylori and gastritis, wondering if it could be related. Denies weight loss, hemoptysis, nocturnal cough.  PERTINENT  PMH / PSH: Gastritis, refugee  OBJECTIVE:   BP 118/72   Pulse 88   Temp 98 F (36.7 C)   Wt 206 lb 6.4 oz (93.6 kg)   SpO2 99%   BMI 26.50 kg/m    General: NAD, pleasant, able to participate in exam Cardiac: RRR, no murmurs. Respiratory: CTAB, normal effort, No wheezes, rales or rhonchi Abdomen: Soft, non-tender, non-distended Extremities: no edema or cyanosis. Skin: warm and dry, no rashes noted Neuro: alert, no obvious focal deficits Psych: Normal affect and mood  COVID POC: Negative  ASSESSMENT/PLAN:   Flushing Episodic warmth in abdomen/back, most consistent with worsening reflux/gastritis. Unclear if patient is having true fevers, but appears less likely. Ddx includes GI vs infection. -HIV and QuantGold to r/o secondary etiology -Restart Protonix 20 mg daily, assess for symptom relief at follow-up in 2 weeks with PCP   Dr. Elberta Fortis, DO [facility]

## 2023-04-03 NOTE — Patient Instructions (Signed)
It was wonderful to see you today! Thank you for choosing [facility].   Please bring ALL of your medications with you to every visit.   Today we talked about:  Please take the Pantoprazole daily for the next 2 weeks until you have follow up. I would try to track when you are having symptoms as well. We are checking some labs today to rule out other causes and I will follow up with you about those results.  Please follow up in 2 weeks   We are checking some labs today. If they are abnormal, I will call you. If they are normal, I will send you a MyChart message (if it is active) or a letter in the mail. If you do not hear about your labs in the next 2 weeks, please call the office.  Call the clinic at [phone] if your symptoms worsen or you have any concerns.  Please be sure to schedule follow up at the front desk before you leave today.   Elberta Fortis, DO Family Medicine

## 2023-04-10 ENCOUNTER — Telehealth: Payer: Self-pay

## 2023-04-10 DIAGNOSIS — K219 Gastro-esophageal reflux disease without esophagitis: Secondary | ICD-10-CM

## 2023-04-10 NOTE — Telephone Encounter (Signed)
Pharmacy Patient Advocate Encounter   Received notification from CoverMyMeds that prior authorization for PANTOPRAZOLE 20MG  is required/requested.   Insurance verification completed.   The patient is insured through [facility] .   Per test claim: PA required; PA submitted to [employer] via CoverMyMeds Key/confirmation #/[facility]. Status is pending  May require patient fail 2 alternatives (omeprazole, esomeprazole, lansoprazole)

## 2023-04-13 NOTE — Telephone Encounter (Signed)
Pharmacy Patient Advocate Encounter  Received notification from [facility] that Prior Authorization for PANTOPRAZOLE has been DENIED. Please advise how you'd like to proceed. Full denial letter will be uploaded to the media tab. See denial reason below.  REASON:   PA #/Case ID/Reference #: [id number]

## 2023-04-14 MED ORDER — OMEPRAZOLE MAGNESIUM 20 MG PO TBEC: 20.0000 mg | DELAYED_RELEASE_TABLET | Freq: Every day | ORAL | 1 refills | Status: DC

## 2023-04-14 NOTE — Addendum Note (Signed)
Addended by: [name] on: 04/14/2023 08:14 AM   Modules accepted: Orders

## 2023-04-17 ENCOUNTER — Ambulatory Visit (INDEPENDENT_AMBULATORY_CARE_PROVIDER_SITE_OTHER): Payer: BC Managed Care – PPO | Admitting: Student

## 2023-04-17 ENCOUNTER — Encounter: Payer: Self-pay | Admitting: Student

## 2023-04-17 VITALS — BP 126/88 | HR 72 | Ht 74.0 in | Wt 209.4 lb

## 2023-04-17 DIAGNOSIS — R7612 Nonspecific reaction to cell mediated immunity measurement of gamma interferon antigen response without active tuberculosis: Secondary | ICD-10-CM | POA: Diagnosis not present

## 2023-04-17 NOTE — Assessment & Plan Note (Signed)
Flare of patient's subjective 90 fever/burning sensation is attributed to possible active TB infection or underlying gastritis/reflux.  The patient endorses mild improvement on trial Protonix his quantGold test is positive.  He had a normal checks x-ray 4-5 months ago however at the time was asymptomatic.  Will repeat checks x-ray at this time and discussed with patient need to refer her out to health department for treatment.  Patient verbalized understanding agreeable to plan. -Ordered chest x-ray -Continue Protonix daily -Pending results we will contact health department for further management.

## 2023-04-17 NOTE — Progress Notes (Signed)
    SUBJECTIVE:   CHIEF COMPLAINT / HPI:   Patient is a 27 year old male who presented today for follow-up.  He was seen 2 weeks ago for sciatic fever and burning sensation in the back and stomach.  Patient was started on trial Protonix 20 mg daily and Quantgold was obtained at the time which came back positive.  Of note patient's previous quant gold was positive in the past.  He has never been treated for positive quant gold.  He denies any cough, night sweats or weight loss.  Patient said his subjective night fevers/ burning abdominal/chest sensation has improved since starting on Protonix.  Right hand pain Patient said he fell on an outstretched right hand last week.  Shortly after started having significant pain on the right hand.  At the time had significant swelling that has since improved.  Pain overall is improved but still very tender with palpation.  Is any numbness or tingling sensations in his hand.  He is able to grab and make a fist with hand.  PERTINENT  PMH / PSH:   OBJECTIVE:   BP 126/88   Pulse 72   Ht 6\' 2"  (1.88 m)   Wt 209 lb 6.4 oz (95 kg)   SpO2 96%   BMI 26.89 kg/m    Physical Exam General: Alert, well appearing, NAD Cardiovascular: RRR, No Murmurs, Normal S2/S2 Respiratory: CTAB, No wheezing or Rales Abdomen: No distension or tenderness Right hand: Mildly edematous and exquisitely tender with palpation.  Making fist, negative Frankenstein test.   ASSESSMENT/PLAN:   Positive QuantiFERON-TB Gold test Flare of patient's subjective 90 fever/burning sensation is attributed to possible active TB infection or underlying gastritis/reflux.  The patient endorses mild improvement on trial Protonix his quantGold test is positive.  He had a normal checks x-ray 4-5 months ago however at the time was asymptomatic.  Will repeat checks x-ray at this time and discussed with patient need to refer her out to health department for treatment.  Patient verbalized understanding  agreeable to plan. -Ordered chest x-ray -Continue Protonix daily -Pending results we will contact health department for further management.  Right hand pain Suspect possible fracture vs sprain.  Given exquisitely tender on exam will obtain x-ray to rule out fracture. -Continue/ibuprofen for pain -R Hand x-ray ordered -Provided patient with work note to excuse him from work today.   Jerre Simon, MD [facility]

## 2023-04-17 NOTE — Patient Instructions (Signed)
It was wonderful to see you today. Thank you for allowing me to be a part of your care. Below is a short summary of what we discussed at your visit today:  Wanted to inform you that your quant gold which is used to test for TB was positive.  This looks to be positive in the past as well.  We will obtain a checks x-ray to make sure you do not have active TB infection.  Health department will need to be notified for a positive quant gold this is a standard practice and we are required to report these results to the health department.  They could reach out to you about possible treatment.  Some concern of possible fracture of your hand, I have put in x-ray of your hand to rule out fracture.  He can continue to do Tylenol or ibuprofen for pain at this time.  Please bring all of your medications to every appointment!  If you have any questions or concerns, please do not hesitate to contact us via phone or MyChart message.   Jerre Simon, MD [facility]

## 2023-04-20 ENCOUNTER — Ambulatory Visit
Admission: RE | Admit: 2023-04-20 | Discharge: 2023-04-20 | Disposition: A | Discharge status: Home / Self Care | Payer: BC Managed Care – PPO | Source: Ambulatory Visit | Attending: Family Medicine | Admitting: Family Medicine

## 2023-04-20 DIAGNOSIS — M79641 Pain in right hand: Secondary | ICD-10-CM | POA: Diagnosis not present

## 2023-04-20 DIAGNOSIS — S62342A Nondisplaced fracture of base of third metacarpal bone, right hand, initial encounter for closed fracture: Secondary | ICD-10-CM | POA: Diagnosis not present

## 2023-04-20 DIAGNOSIS — R7611 Nonspecific reaction to tuberculin skin test without active tuberculosis: Secondary | ICD-10-CM | POA: Diagnosis not present

## 2023-04-20 DIAGNOSIS — R7612 Nonspecific reaction to cell mediated immunity measurement of gamma interferon antigen response without active tuberculosis: Secondary | ICD-10-CM

## 2023-04-23 ENCOUNTER — Telehealth: Payer: Self-pay | Admitting: Student

## 2023-04-23 NOTE — Telephone Encounter (Signed)
Called patient to inform him that his x-ray showed nondisplaced right third metacarpal base fracture.  No surgery needed at this time.  However patient will most likely need casting/splint.  The patient had called the sports medicine team about casting and they will be willing to take him in on Thursday the fifth at 11:30 AM.  Patient said he is able to make this appointment.  Sports medicine will call patient to finalize his appointment and give him additional information.  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  Inform patient will be reporting results to the health department and they will reach out to him about treatment process.

## 2023-04-24 ENCOUNTER — Telehealth: Payer: Self-pay

## 2023-04-24 NOTE — Telephone Encounter (Signed)
Faxed form [facility] [id number])  to [employer] at [facility] Department.  Glennie Hawk, CMA

## 2023-04-24 NOTE — Telephone Encounter (Signed)
-----   Message from Jerre Simon sent at 04/23/2023 11:27 AM EDT ----- Regarding: Asked TB results to HD This patient's quant inform gold test was positive and x-ray was negative.  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  Called the Department of Health and was unable to reach Tammy but I left a voice note.  Please can we try to fax the order for her to go to test and x-ray to the number provided below. fax results to: [phone]   Call the TB referral line at [phone] (Nurse is [employer])  I have informed the patient the health department is most likely going to contact him about treatment.  JN

## 2023-04-30 ENCOUNTER — Ambulatory Visit (INDEPENDENT_AMBULATORY_CARE_PROVIDER_SITE_OTHER): Payer: BC Managed Care – PPO | Admitting: Family Medicine

## 2023-04-30 VITALS — BP 118/84 | Ht 74.0 in | Wt 204.0 lb

## 2023-04-30 DIAGNOSIS — M24443 Recurrent dislocation, unspecified hand: Secondary | ICD-10-CM | POA: Diagnosis not present

## 2023-04-30 DIAGNOSIS — M79641 Pain in right hand: Secondary | ICD-10-CM | POA: Diagnosis not present

## 2023-04-30 NOTE — Patient Instructions (Signed)
You have a small fracture at the base of your third metacarpal. This will heal with conservative treatment. Wear the brace at all times except when showering or icing the area (includes wear when you sleep). Icing 15 minutes at a time as needed. Tylenol, voltaren gel if needed. Follow up with me in 4 weeks - anticipate clearing you without restrictions at that time.

## 2023-05-04 ENCOUNTER — Encounter: Payer: Self-pay | Admitting: Family Medicine
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
## 2023-05-27 ENCOUNTER — Other Ambulatory Visit (INDEPENDENT_AMBULATORY_CARE_PROVIDER_SITE_OTHER): Payer: BC Managed Care – PPO

## 2023-05-27 ENCOUNTER — Ambulatory Visit (INDEPENDENT_AMBULATORY_CARE_PROVIDER_SITE_OTHER): Payer: BC Managed Care – PPO | Admitting: Orthopedic Surgery

## 2023-05-27 ENCOUNTER — Other Ambulatory Visit (INDEPENDENT_AMBULATORY_CARE_PROVIDER_SITE_OTHER): Payer: Self-pay

## 2023-05-27 DIAGNOSIS — S63054A Dislocation of other carpometacarpal joint of right hand, initial encounter: Secondary | ICD-10-CM | POA: Diagnosis not present

## 2023-05-27 DIAGNOSIS — S63056D Dislocation of other carpometacarpal joint of unspecified hand, subsequent encounter: Secondary | ICD-10-CM

## 2023-05-27 DIAGNOSIS — S63055A Dislocation of other carpometacarpal joint of left hand, initial encounter: Secondary | ICD-10-CM

## 2023-05-27 NOTE — Progress Notes (Signed)
Joshua Rush - 27 y.o. male MRN [id number]  Date of birth: February 09, 1996  Office Visit Note: Visit Date: 05/27/2023 PCP: Jerre Simon, MD Referred by: Lenda Kelp, MD  Subjective: No chief complaint on file.  HPI: Joshua Rush is a pleasant 27 y.o. 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  He also has a recent history of a right long finger metacarpal base fracture that was treated nonoperatively, injury was approximately 6 weeks ago and is doing well.  He states that he is still able to use bilateral hands without significant issue, denies any frank pain.  He does notice that the thumb joints will occasionally slide out of place while being used.  Denies any significant numbness or tingling.  Has not undergone any prior treatments or significant workup for the chronic CMC subluxation.  Pertinent ROS were reviewed with the patient and found to be negative unless otherwise specified above in HPI.   Visit Reason: bilateral hand CMC instability Duration of symptoms: years Hand dominance: right Occupation: Technician Diabetic: No Smoking: No Heart/Lung History: no Blood Thinners:  no  Prior Testing/EMG: xray 04/21/23 Injections (Date): none Treatments: brace-right Prior Surgery:none   Assessment & Plan: Visit Diagnoses:  1. Dislocation of carpometacarpal joint, unspecified laterality, subsequent encounter     Plan: Extensive discussion was had with the patient today regarding his bilateral chronic CMC subluxation and instability.  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  He has hypermobile thumb and small finger bilaterally, hyperextends elbows bilaterally, hyperextends knees bilaterally and can nearly get palms flat to the floor with knees extended.  I explained to him that given his underlying soft tissue disorder, this is what is creating  his instability at his bilateral CMC joints of the thumbs.  This is easily reproducible on clinical examination today.  In order to achieve stability at the thumb [facility] interval, I discussed the possibility for [facility] fusion in the future.  Given that he is minimally symptomatic currently, this is unlikely indicated at this juncture.  I explained that should he experience increasing pain or instability that is preventing function in the future, this would be a reasonable option to consider.  From a soft tissue standpoint, I would be unreliable to rely on his soft tissue structures for appropriate stability from a surgical standpoint long-term.  As far as the right long finger metacarpal, this demonstrates appropriate healing on today's examination with minimal tenderness and appropriate range of motion.  I am happy to see him back as needed in the future for his bilateral chronic CMC subluxation and to discuss further treatment options.  He expressed full understanding.  Greater than 45 minutes was spent reviewing previous records, documentation, imaging as well as examination and discussion with patient.  Follow-up: No follow-ups on file.   Meds & Orders: No orders of the defined types were placed in this encounter.   Orders Placed This Encounter  Procedures   XR Wrist Complete Left   XR Wrist Complete Right     Procedures: No procedures performed      Clinical History: No specialty comments available.  He reports that he has quit smoking. He has never used smokeless tobacco.  Recent Labs    12/19/22 1342  HGBA1C 4.6    Objective:   Vital Signs: There were no vitals taken for this visit.  Physical Exam  Gen: Well-appearing, in no acute distress; non-toxic  CV: Regular Rate. Well-perfused. Warm.  Resp: Breathing unlabored on room air; no wheezing. Psych: Fluid speech in conversation; appropriate affect; normal thought process  Ortho Exam PHYSICAL EXAM:  General: Patient is  well appearing and in no distress. Cervical spine mobility is full in all directions:  Skin and Muscle: No significant skin changes are apparent to upper extremities.  Muscle bulk and contour normal, no signs of atrophy.     Range of Motion and Palpation Tests: Mobility is full about the elbows with flexion and extension.  Forearm supination and pronation are 85/85 bilaterally.  Wrist flexion/extension is 75/65 bilaterally.  Digital flexion and extension are full.  Thumb opposition is full to the base of the small fingers bilaterally.    There is frank instability noted of the bilateral thumb CMC joints, able to sublux dorsally when stressed, palpable clunk is felt with relocation bilaterally.  No significant pain bilaterally.  No significant tenderness over the long finger metacarpal right side, appropriate digital range of motion   Neurologic, Vascular, Motor: Sensation is intact to light touch in the median/radial/ulnar distributions.     Fingers pink and well perfused.  Capillary refill is brisk.      Lab Results  Component Value Date   HGBA1C 4.6 12/19/2022     Imaging: XR Wrist Complete Left  Result Date: 05/27/2023 X-rays of the left wrist demonstrate appropriate radiocarpal and midcarpal intervals without significant abnormalities.  XR Wrist Complete Right  Result Date: 05/27/2023 X-rays of the right wrist were completed today X-rays demonstrate significant subluxation of the [facility] of the thumb, there is notable radial subluxation seen on multiple views.  X-rays also demonstrate well-healing base of the long finger metacarpal fracture, CMC of the long finger is well located in all views.   Past Medical/Family/Surgical/Social History: Medications & Allergies reviewed per EMR, new medications updated. 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 pylori infection 08/04/2020   Thrombocytopenia (HCC) 07/06/2020   Positive QuantiFERON-TB Gold test 05/22/2020   Refugee health examination 05/15/2020   Past Medical History:  Diagnosis Date   Alcohol use    Bacterial external ear infection 07/06/2020   Dyspepsia 06/18/2020   Gastritis    Headache 03/24/2021   Steatosis (HCC)    Subluxation of carpometacarpal joint of thumb 06/18/2020   Varicella non-immune 12/22/2022   Family History  Problem Relation Age of Onset   HIV Mother    No past surgical history on file. Social History   Occupational History   Not on file  Tobacco Use   Smoking status: Former   Smokeless tobacco: Never  Substance and Sexual Activity   Alcohol use: Yes    Comment: intermittently drinks > 5 drinks per day    Drug use: Never   Sexual activity: Yes    Partners: Female    Konstantina Nachreiner (Fara Boros) Denese Killings, M.D. [facility] 11:06 AM

## 2023-05-28 ENCOUNTER — Encounter: Payer: Self-pay | Admitting: Family Medicine

## 2023-05-28 ENCOUNTER — Ambulatory Visit (INDEPENDENT_AMBULATORY_CARE_PROVIDER_SITE_OTHER): Payer: BC Managed Care – PPO | Admitting: Family Medicine

## 2023-05-28 VITALS — BP 138/88 | Ht 74.0 in | Wt 205.0 lb

## 2023-05-28 DIAGNOSIS — M24443 Recurrent dislocation, unspecified hand: Secondary | ICD-10-CM

## 2023-05-28 DIAGNOSIS — S6991XD Unspecified injury of right wrist, hand and finger(s), subsequent encounter: Secondary | ICD-10-CM
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
## 2023-06-30 ENCOUNTER — Encounter (HOSPITAL_COMMUNITY): Payer: Self-pay

## 2023-06-30 ENCOUNTER — Ambulatory Visit (HOSPITAL_COMMUNITY)
Admission: EM | Admit: 2023-06-30 | Discharge: 2023-06-30 | Disposition: A | Discharge status: Home / Self Care | Payer: BC Managed Care – PPO | Attending: Family Medicine | Admitting: Family Medicine

## 2023-06-30 DIAGNOSIS — R002 Palpitations: Secondary | ICD-10-CM

## 2023-06-30 NOTE — ED Triage Notes (Signed)
Pt states palpitations 3 months ago on and off.  States he had an episode last night into this morning. HR now in the 70's.  States he has been seen for this in the past and they don't find anything.

## 2023-06-30 NOTE — Discharge Instructions (Addendum)
You were seen today for palpitations.  Your EKG is normal today.  As discussed, you likely need to discuss a heart monitor with your doctor again in the hopes to capture these events to help decipher what these are.   If this should happen again, last more than several minutes, or associated with chest pain or shortness of breath you need to go to the ER for further evaluation.

## 2023-06-30 NOTE — ED Provider Notes (Signed)
[facility]    CSN: [id number] Arrival date & time: 06/30/23  1222      History   Chief Complaint Chief Complaint  Patient presents with   Palpitations    HPI Joshua Rush is a 27 y.o. male.    Palpitations Associated symptoms: no chest pain and no shortness of breath    Patient is here for palpitations.  He has had palpitations off/on the last 3 months.  The other night this happened while in the middle of the night several times, and woke him.  This happened again yesterday while in the car, and again this morning.  This generally lasts about 2 minutes, and stops, and may start again.   No sob or pain associated.   He gets very nervous about it.  One time after drinking etoh, which he has avoided since then.  It happened again while in the waiting room, but not currently.  He has talked to his pcp about this, and offered a monitor, which he declined.  (While sitting in the chair he states he had symptoms again, exam normal)       Past Medical History:  Diagnosis Date   Alcohol use    Bacterial external ear infection 07/06/2020   Dyspepsia 06/18/2020   Gastritis    Headache 03/24/2021   Steatosis (HCC)    Subluxation of carpometacarpal joint of thumb 06/18/2020   Varicella non-immune 12/22/2022    Patient Active Problem List   Diagnosis Date Noted   Flushing 04/03/2023   Elevated alkaline phosphatase level 12/22/2022   Tachycardia 12/22/2022   Weakness 12/22/2022   Pterygium eye, 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 No pertinent surgical history.     Home Medications    Prior to Admission medications   Medication Sig Start Date End Date Taking? Authorizing Provider  omeprazole (PRILOSEC OTC) 20 MG tablet Take 1 tablet (20 mg total) by mouth daily. 04/14/23   [name],  MD  simethicone (GAS-X) 80 MG chewable tablet Chew 1 tablet (80 mg total) by mouth every 6 (six) hours as needed (bloating). 04/20/20 06/12/20  [name], NP    Family History Family History  Problem Relation Age of Onset   HIV Mother     Social History Social History   Tobacco Use   Smoking status: Former   Smokeless tobacco: Never  Substance Use Topics   Alcohol use: Yes    Comment: intermittently drinks > 5 drinks per day    Drug use: Never     Allergies   Patient has no known allergies.   Review of Systems Review of Systems  Constitutional: Negative.   HENT: Negative.    Respiratory: Negative.  Negative for chest tightness and shortness of breath.   Cardiovascular:  Positive for palpitations. Negative for chest pain.  Genitourinary: Negative.   Musculoskeletal: Negative.   Psychiatric/Behavioral: Negative.       Physical Exam Triage Vital Signs ED Triage Vitals  Encounter Vitals Group     BP 06/30/23 1249 (!) 150/87     Systolic BP Percentile --      Diastolic BP Percentile --      Pulse Rate 06/30/23 1249 69     Resp 06/30/23 1249 16     Temp 06/30/23 1249 98 F (36.7 C)     Temp Source 06/30/23 1249  Oral     SpO2 06/30/23 1249 98 %     Weight --      Height --      Head Circumference --      Peak Flow --      Pain Score 06/30/23 1250 0     Pain Loc --      Pain Education --      Exclude from Growth Chart --    No data found.  Updated Vital Signs BP (!) 150/87 (BP Location: Left Arm)   Pulse 69   Temp 98 F (36.7 C) (Oral)   Resp 16   SpO2 98%   Visual Acuity Right Eye Distance:   Left Eye Distance:   Bilateral Distance:    Right Eye Near:   Left Eye Near:    Bilateral Near:     Physical Exam Constitutional:      Appearance: Normal appearance.  Cardiovascular:     Rate and Rhythm: Normal rate and regular rhythm.  Pulmonary:     Effort: Pulmonary effort is normal.     Breath sounds: Normal breath sounds.  Neurological:      General: No focal deficit present.     Mental Status: He is alert.  Psychiatric:        Mood and Affect: Mood normal.     UC Treatments / Results  Labs (all labs ordered are listed, but only abnormal results are displayed) Labs Reviewed - No data to display  EKG NSR; no change from previous;  normal EKG  Radiology No results found.  Procedures Procedures (including critical care time)  Medications Ordered in UC Medications - No data to display  Initial Impression / Assessment and Plan / UC Course  I have reviewed the triage vital signs and the nursing notes.  Pertinent labs & imaging results that were available during my care of the patient were reviewed by me and considered in my medical decision making (see chart for details).    Final Clinical Impressions(s) / UC Diagnoses   Final diagnoses:  Palpitations     Discharge Instructions      You were seen today for palpitations.  Your EKG is normal today.  As discussed, you likely need to discuss a heart monitor with your doctor again in the hopes to capture these events to help decipher what these are.   If this should happen again, last more than several minutes, or associated with chest pain or shortness of breath you need to go to the ER for further evaluation.     ED Prescriptions   None    PDMP not reviewed this encounter.   Jannifer Franklin, MD 06/30/23 1331

## 2023-07-27 ENCOUNTER — Encounter: Payer: Self-pay | Admitting: Student

## 2023-07-27 ENCOUNTER — Ambulatory Visit (INDEPENDENT_AMBULATORY_CARE_PROVIDER_SITE_OTHER): Payer: BC Managed Care – PPO | Admitting: Student

## 2023-07-27 VITALS — BP 143/100 | HR 96 | Ht 74.0 in | Wt 221.0 lb

## 2023-07-27 DIAGNOSIS — R309 Painful micturition, unspecified: Secondary | ICD-10-CM | POA: Diagnosis not present

## 2023-07-27 DIAGNOSIS — Z113 Encounter for screening for infections with a predominantly sexual mode of transmission: Secondary | ICD-10-CM

## 2023-07-27 LAB — POCT URINALYSIS DIP (MANUAL ENTRY)
Bilirubin, UA: NEGATIVE
Blood, UA: NEGATIVE
Glucose, UA: NEGATIVE mg/dL
Ketones, POC UA: NEGATIVE mg/dL
Leukocytes, UA: NEGATIVE
Nitrite, UA: NEGATIVE
Protein Ur, POC: NEGATIVE mg/dL
Spec Grav, UA: 1.02 (ref 1.010–1.025)
Urobilinogen, UA: 4 U/dL — AB
pH, UA: 7 (ref 5.0–8.0)

## 2023-07-27 NOTE — Patient Instructions (Signed)
Good to see you today.  Today we ordered lab to check you for UTI which was negative.  We also collected sample to test you for gonorrhea, chlamydia, HIV, syphilis, kidney and liver function.  Please make sure to return tomorrow to give your urine sample.  Follow-up in 1 week.

## 2023-07-27 NOTE — Progress Notes (Signed)
    SUBJECTIVE:   CHIEF COMPLAINT / HPI: Dysuria.  27 y.o.  year old male with no significant past medical history Endorses burning sensation with voiding that started 4 weeks ago.  He also endorses increased frequency and urgency.  He is sexually active and inconsistent with barrier protection Endorses dark penile discharge, itchiness No hematuria or recent change in medication. Also have associated superior pubic pain/tenderness Had a recent STD testing at the [facility] that was negative He was tested for GC/chlamydia/HIV    PERTINENT  PMH / PSH: Reviewed  OBJECTIVE:   BP (!) 137/94   Pulse 82   Ht 6\' 2"  (1.88 m)   Wt 221 lb (100.2 kg)   SpO2 98%   BMI 28.37 kg/m    Physical Exam General: Alert, well appearing, NAD Cardiovascular: RRR, No Murmurs, Normal S2/S2 Respiratory: CTAB, No wheezing or Rales Abdomen: Soft, no distention, no rebound tenderness or guarding.  Positive suprapubic tenderness with palpation. Skin: Warm and dry  ASSESSMENT/PLAN:   Dysuria Unclear Clear cause of patient's dysuria.  However given his intentional suspect possible STI despite recent testing being negative we will retest today.  Also in the differential include UTI and acute cystitis given presentation of pubic pain, creased urinary frequency and urgency.  Urinalysis was generally negative other than elevated urobilinogen will obtain CMP to assess hepatic etiology. -Ordered lab for GC/chlamydia/HIV/RPR/CBC/CMP -Urinalysis obtained -Recommend use of over-the-counter NSAIDs -Encourage adequate hydration       Jerre Simon, MD [facility]

## 2023-07-28 LAB — COMPREHENSIVE METABOLIC PANEL
ALT: 14 [IU]/L (ref 0–44)
AST: 13 [IU]/L (ref 0–40)
Albumin: 4.6 g/dL (ref 4.3–5.2)
Alkaline Phosphatase: 116 [IU]/L (ref 44–121)
BUN/Creatinine Ratio: 9 (ref 9–20)
BUN: 10 mg/dL (ref 6–20)
Bilirubin Total: 1.1 mg/dL (ref 0.0–1.2)
CO2: 23 mmol/L (ref 20–29)
Calcium: 9.6 mg/dL (ref 8.7–10.2)
Chloride: 101 mmol/L (ref 96–106)
Creatinine, Ser: 1.06 mg/dL (ref 0.76–1.27)
Globulin, Total: 3 g/dL (ref 1.5–4.5)
Glucose: 98 mg/dL (ref 70–99)
Potassium: 3.9 mmol/L (ref 3.5–5.2)
Sodium: 137 mmol/L (ref 134–144)
Total Protein: 7.6 g/dL (ref 6.0–8.5)
eGFR: 100 mL/min/{1.73_m2} (ref >59)

## 2023-07-28 LAB — CBC
Hematocrit: 46.6 % (ref 37.5–51.0)
Hemoglobin: 15.2 g/dL (ref 13.0–17.7)
MCH: 27.2 pg (ref 26.6–33.0)
MCHC: 32.6 g/dL (ref 31.5–35.7)
MCV: 84 fL (ref 79–97)
Platelets: 118 10*3/uL — ABNORMAL LOW (ref 150–450)
RBC: 5.58 x10E6/uL (ref 4.14–5.80)
RDW: 11.9 % (ref 11.6–15.4)
WBC: 6.4 10*3/uL (ref 3.4–10.8)

## 2023-07-28 LAB — RPR: RPR Ser Ql: NONREACTIVE

## 2023-07-28 LAB — HIV ANTIBODY (ROUTINE TESTING W REFLEX): HIV Screen 4th Generation wRfx: NONREACTIVE

## 2023-08-28 ENCOUNTER — Ambulatory Visit (INDEPENDENT_AMBULATORY_CARE_PROVIDER_SITE_OTHER): Payer: BC Managed Care – PPO | Admitting: Family Medicine

## 2023-08-28 VITALS — BP 124/82 | HR 88 | Ht 74.0 in | Wt 210.8 lb

## 2023-08-28 DIAGNOSIS — R309 Painful micturition, unspecified: Secondary | ICD-10-CM | POA: Diagnosis not present

## 2023-08-28 DIAGNOSIS — R300 Dysuria: Secondary | ICD-10-CM | POA: Insufficient documentation

## 2023-08-28 NOTE — Patient Instructions (Addendum)
 Dear Bentley Buss  Today we discussed the following concerns and plans:  Pain with urination and back pain: - I am sending some low back exercises.  Give these a try a few days a week to strengthen your back and increase your flexibility. - You may have constipation contributing to this.  Buy MiraLAX (polyethylene glycol) at your pharmacy and take 1 capful once every day.  If you do not begin to have regular bowel movements you may take 1 capful 2 times a day. -You may also take Tylenol  and/or ibuprofen for discomfort as needed. - I will also refer you to urology.  They should call you within the next couple of weeks to schedule an appointment.  If they do not call you, please let me know.  If you begin having severe pain you should go to the emergency room for further evaluation.  If you have any concerns, please call the clinic or schedule an appointment.  It was a pleasure to take care of you today. Be well!  Lauraine Norse, DO [facility], PGY-1

## 2023-08-28 NOTE — Assessment & Plan Note (Signed)
 Ongoing for the last 2 months, with negative UA and STI screening at last visit 1 month ago.  Given timing and severity of patient's symptoms it is possible that he passed a kidney stone earlier this week as he is now felt some relief of his symptoms since then.  I also suspect he has an overlying component of constipation which is contributing to his discomfort. -No indication for further lab workup or infectious testing at this time - Patient will start MiraLAX 1 capful daily, and increase to twice daily if this does not improve his constipation - Provided with low back exercises for strengthening and increasing flexibility which I hope will help with his discomfort.  He may also take Tylenol  and/or ibuprofen for discomfort. - Ambulatory referral to urology for further evaluation per patient request - Return precautions provided

## 2023-08-28 NOTE — Progress Notes (Signed)
    SUBJECTIVE:   CHIEF COMPLAINT / HPI:   Follow-up-painful urination Patient initially seen 07/27/2023 for 4 weeks of burning sensation with urination, as well as frequency and urgency.  He also reported dark penile discharge.  At that time workup was negative for UTI, HIV, RPR.  CBC and CMP done that day were also WNL.  At that time he also reported that he had recently been tested for GC/chlamydia at [facility] and this was negative. Today, he reports ongoing frequency and pain with urination.  He also shares that he has had some intermittent low back pain.  Earlier this week he had severe right sided pain which she could feel traveling from his low back to his groin area.  This is since resolved.  He questions if he could have had a kidney stone.  No fevers or blood in urine.  He also describes bowel movements every 2 to 3 days which are small and hard, which is unusual for him.  He has been intermittently taking herbal medicines including dandelion, beetroot, cranberry, and liver detox pills/milk thistle OTC from [facility] since onset of the symptoms.  PERTINENT  PMH / PSH: None   OBJECTIVE:   BP 124/82   Pulse 88   Ht 6' 2 (1.88 m)   Wt 210 lb 12.8 oz (95.6 kg)   SpO2 99%   BMI 27.07 kg/m   General: Well-appearing, pleasant, no acute distress. Cardio: Regular rate, regular rhythm, no murmurs on exam. Pulm: Clear, no wheezing, no crackles. No increased work of breathing. Abdominal: bowel sounds present, soft throughout though with some fullness in the LLQ, mild tenderness to palpation in the umbilical region and LLQ. Non-distended. Extremities: no peripheral edema. Moves all extremities equally.   ASSESSMENT/PLAN:   Dysuria Ongoing for the last 2 months, with negative UA and STI screening at last visit 1 month ago.  Given timing and severity of patient's symptoms it is possible that he passed a kidney stone earlier this week as he is now felt some relief of his symptoms since then.  I  also suspect he has an overlying component of constipation which is contributing to his discomfort. -No indication for further lab workup or infectious testing at this time - Patient will start MiraLAX 1 capful daily, and increase to twice daily if this does not improve his constipation - Provided with low back exercises for strengthening and increasing flexibility which I hope will help with his discomfort.  He may also take Tylenol  and/or ibuprofen for discomfort. - Ambulatory referral to urology for further evaluation per patient request - Return precautions provided     Lauraine Norse, DO [facility]

## 2023-09-29 ENCOUNTER — Encounter: Payer: Self-pay | Admitting: Student

## 2023-09-29 ENCOUNTER — Ambulatory Visit (INDEPENDENT_AMBULATORY_CARE_PROVIDER_SITE_OTHER): Payer: BC Managed Care – PPO | Admitting: Student

## 2023-09-29 VITALS — BP 110/80 | HR 91 | Temp 98.4°F | Ht 74.0 in | Wt 214.6 lb

## 2023-09-29 DIAGNOSIS — R1010 Upper abdominal pain, unspecified: Secondary | ICD-10-CM | POA: Diagnosis not present

## 2023-09-29 DIAGNOSIS — R350 Frequency of micturition: Secondary | ICD-10-CM | POA: Diagnosis not present

## 2023-09-29 LAB — POCT URINALYSIS DIP (MANUAL ENTRY)
Bilirubin, UA: NEGATIVE
Blood, UA: NEGATIVE
Glucose, UA: NEGATIVE mg/dL
Ketones, POC UA: NEGATIVE mg/dL
Leukocytes, UA: NEGATIVE
Nitrite, UA: NEGATIVE
Protein Ur, POC: NEGATIVE mg/dL
Spec Grav, UA: 1.01 (ref 1.010–1.025)
Urobilinogen, UA: 0.2 U/dL
pH, UA: 6.5 (ref 5.0–8.0)

## 2023-09-29 NOTE — Patient Instructions (Addendum)
 Pleasure to see you today.  For your abdominal pain can do Tylenol  for pain.  We ordered abdominal CT to look at your liver and your abdomen.  Will call you to schedule that appointment once approved by your insurance.  Labs today were ordered to look at your liver enzymes and your pancreatic enzyme as well.  Your urine test today was normal.

## 2023-09-29 NOTE — Progress Notes (Signed)
    SUBJECTIVE:   CHIEF COMPLAINT / HPI:   28 year old male with history of H. pylori and elevated ALP Presenting today for continued right kidney pain Previously seen for pain with urination Multiple UA has been normal Prior STD testing including GC chlamydia, RPR, HIV all negative. CMP and CBC have remained stable Today patient reports his pain is on the right upper quadrant of his abdomen. Mostly worse with eating heavy food.   Denies any fever, vomiting or blood in stool. He has also had normal bowel movements during this time. Denies any alcohol use or chronic NSAID use.    PERTINENT  PMH / PSH: Pylori, elevated ALP  OBJECTIVE:   BP 110/80   Pulse 91   Temp 98.4 F (36.9 C)   Ht 6' 2 (1.88 m)   Wt 214 lb 9.6 oz (97.3 kg)   SpO2 100%   BMI 27.55 kg/m    Physical Exam General: Alert, well appearing, NAD Cardiovascular: RRR, No Murmurs, Normal S2/S2 Respiratory: CTAB, No wheezing or Rales Abdomen: Soft, no distension. Mild epigastric tenderness, no guarding or rebound tenderness Extremities: No edema on extremities     ASSESSMENT/PLAN:   Pain of upper abdomen Broad differentials for right abdominal pain including cholelithiasis, appendicitis, constipation, gastroparesis, gastric ulcer.  Low concerns for this process in the absence of any constitutional symptoms and stable vitals.  Given the patient's abdominal pain has been a chronic issue and mostly worse after heavy meals and reported prior history of elevated ALP concerning for possible cholelithiasis. Will obtain imaging to further evaluate abdominal pain.  UA obtained today was negative. -Abdominal CT with contrast ordered -Obtained UA  - CMP and lipase -Recommend use of Tylenol  for pain      Norleen April, MD [facility]

## 2023-09-29 NOTE — Assessment & Plan Note (Signed)
 Broad differentials for right abdominal pain including cholelithiasis, appendicitis, constipation, gastroparesis, gastric ulcer.  Low concerns for this process in the absence of any constitutional symptoms and stable vitals.  Given the patient's abdominal pain has been a chronic issue and mostly worse after heavy meals and reported prior history of elevated ALP concerning for possible cholelithiasis. Will obtain imaging to further evaluate abdominal pain.  UA obtained today was negative. -Abdominal CT with contrast ordered -Obtained UA  - CMP and lipase -Recommend use of Tylenol  for pain

## 2023-09-30 LAB — COMPREHENSIVE METABOLIC PANEL
ALT: 12 [IU]/L (ref 0–44)
AST: 12 [IU]/L (ref 0–40)
Albumin: 4.9 g/dL (ref 4.3–5.2)
Alkaline Phosphatase: 97 [IU]/L (ref 44–121)
BUN/Creatinine Ratio: 7 — ABNORMAL LOW (ref 9–20)
BUN: 7 mg/dL (ref 6–20)
Bilirubin Total: 1.2 mg/dL (ref 0.0–1.2)
CO2: 25 mmol/L (ref 20–29)
Calcium: 9.7 mg/dL (ref 8.7–10.2)
Chloride: 101 mmol/L (ref 96–106)
Creatinine, Ser: 1.06 mg/dL (ref 0.76–1.27)
Globulin, Total: 2.9 g/dL (ref 1.5–4.5)
Glucose: 95 mg/dL (ref 70–99)
Potassium: 4 mmol/L (ref 3.5–5.2)
Sodium: 139 mmol/L (ref 134–144)
Total Protein: 7.8 g/dL (ref 6.0–8.5)
eGFR: 100 mL/min/{1.73_m2} (ref >59)

## 2023-09-30 LAB — LIPASE: Lipase: 46 U/L (ref 13–78)

## 2023-10-23 ENCOUNTER — Other Ambulatory Visit: Payer: BC Managed Care – PPO

## 2023-11-22 ENCOUNTER — Emergency Department (HOSPITAL_COMMUNITY)

## 2023-11-22 ENCOUNTER — Encounter (HOSPITAL_COMMUNITY): Payer: Self-pay | Admitting: Emergency Medicine

## 2023-11-22 ENCOUNTER — Emergency Department (HOSPITAL_COMMUNITY)
Admission: EM | Admit: 2023-11-22 | Discharge: 2023-11-22 | Disposition: A | Discharge status: Home / Self Care | Attending: Emergency Medicine | Admitting: Emergency Medicine

## 2023-11-22 DIAGNOSIS — M545 Low back pain, unspecified: Secondary | ICD-10-CM | POA: Insufficient documentation

## 2023-11-22 DIAGNOSIS — R519 Headache, unspecified: Secondary | ICD-10-CM | POA: Diagnosis not present

## 2023-11-22 DIAGNOSIS — Y92410 Unspecified street and highway as the place of occurrence of the external cause: Secondary | ICD-10-CM | POA: Insufficient documentation

## 2023-11-22 MED ORDER — DIPHENHYDRAMINE HCL 50 MG/ML IJ SOLN
25.0000 mg | Freq: Once | INTRAMUSCULAR | Status: AC
  Administered 2023-11-22: 25 mg via INTRAMUSCULAR
  Filled 2023-11-22: qty 1

## 2023-11-22 MED ORDER — PROCHLORPERAZINE EDISYLATE 10 MG/2ML IJ SOLN
10.0000 mg | Freq: Once | INTRAMUSCULAR | Status: AC
  Administered 2023-11-22: 10 mg via INTRAMUSCULAR
  Filled 2023-11-22: qty 2

## 2023-11-22 MED ORDER — KETOROLAC TROMETHAMINE 15 MG/ML IJ SOLN
15.0000 mg | Freq: Once | INTRAMUSCULAR | Status: AC
  Administered 2023-11-22: 15 mg via INTRAMUSCULAR
  Filled 2023-11-22: qty 1
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
## 2023-11-22 NOTE — ED Notes (Signed)
 EDP at bedside

## 2023-11-22 NOTE — Discharge Instructions (Signed)
 Take 4 over the counter ibuprofen tablets 3 times a day or 2 over-the-counter naproxen tablets twice a day for pain. Also take tylenol 1000mg (2 extra strength) four times a day.

## 2023-12-25 ENCOUNTER — Encounter: Payer: Self-pay | Admitting: Student

## 2023-12-25 ENCOUNTER — Ambulatory Visit (INDEPENDENT_AMBULATORY_CARE_PROVIDER_SITE_OTHER): Admitting: Student

## 2023-12-25 VITALS — BP 133/83 | HR 96 | Ht 74.0 in | Wt 207.4 lb

## 2023-12-25 DIAGNOSIS — R1010 Upper abdominal pain, unspecified: Secondary | ICD-10-CM | POA: Diagnosis present

## 2023-12-25 MED ORDER — OMEPRAZOLE 40 MG PO CPDR: 40.0000 mg | DELAYED_RELEASE_CAPSULE | Freq: Every day | ORAL | 3 refills | Status: DC

## 2023-12-25 NOTE — Patient Instructions (Signed)
 Pleasure to see you today.  Today I ordered labs to check your liver function, hepatitis and electrolyte levels.  I am also placed order for Ultra sound of your liver and gallbladder.  They will call you to schedule an appointment.  I have also sent in prescription for omeprazole  40 mg please take this daily for the next 3 months.

## 2023-12-25 NOTE — Progress Notes (Addendum)
    SUBJECTIVE:   CHIEF COMPLAINT / HPI:   Joshua Rush is a 28 year old male who presents with right-sided abdominal pain after eating.  He experiences right-sided abdominal pain after eating, described as feeling like 'there's a soul inside' and rated between 1 to 8 on a scale of 10. The pain is alleviated by avoiding certain foods and using natural remedies such as beetroots, turmeric, lemons, and apple cider vinegar. He also uses Macrogol. Normal BM, report 1 time nausea and vomiting with pain. No fevers or chills.  A previous lab workup in February was generally negative, except for a low BUN creatinine ratio of 7. He is undergoing a repeat test today due to persistent symptoms. There is no yellowing of the eyes or skin, dark urine, or blood in the urine. He experiences hiccups when having stomach pains. He denies any recent travel outside the country, though he recently visited [address].  He consumes alcohol infrequently, approximately once a month, and denies regular alcohol consumption.  PERTINENT  PMH / PSH: Reviewed   OBJECTIVE:   BP 133/83   Pulse 96   Ht 6\' 2"  (1.88 m)   Wt 207 lb 6 oz (94.1 kg)   SpO2 100%   BMI 26.63 kg/m    Physical Exam General: Alert, well appearing, NAD Cardiovascular: RRR, No Murmurs, Normal S2/S2 Respiratory: CTAB, No wheezing or Rales Abdomen: Positive bowel sounds, no distension, no guarding or rebound tenderness.  Mild epigastric tenderness  ASSESSMENT/PLAN:   Pain of upper abdomen Chronic pain worsened by eating, possibly gallbladder-related. Labs previously negative. Other suspected differentials include gastritis, dyspepsia or gastric ulcer - Order repeat labs CMP, hepatitis panel - Order liver ultrasound to assess gallbladder. - Consider gastroenterologist referral if labs and ultrasound are negative.     Goble Last, MD [facility]

## 2023-12-25 NOTE — Assessment & Plan Note (Signed)
 Chronic pain worsened by eating, possibly gallbladder-related. Labs previously negative. Other suspected differentials include gastritis, dyspepsia or gastric ulcer - Order repeat labs CMP, hepatitis panel - Order liver ultrasound to assess gallbladder. - Consider gastroenterologist referral if labs and ultrasound are negative.

## 2023-12-26 LAB — ACUTE VIRAL HEPATITIS (HAV, HBV, HCV)
HCV Ab: NONREACTIVE
Hep A IgM: NEGATIVE
Hep B C IgM: NEGATIVE
Hepatitis B Surface Ag: NEGATIVE

## 2023-12-26 LAB — COMPREHENSIVE METABOLIC PANEL WITH GFR
ALT: 10 IU/L (ref 0–44)
AST: 11 IU/L (ref 0–40)
Albumin: 4.8 g/dL (ref 4.3–5.2)
Alkaline Phosphatase: 78 IU/L (ref 44–121)
BUN/Creatinine Ratio: 9 (ref 9–20)
BUN: 9 mg/dL (ref 6–20)
Bilirubin Total: 1.1 mg/dL (ref 0.0–1.2)
CO2: 22 mmol/L (ref 20–29)
Calcium: 10 mg/dL (ref 8.7–10.2)
Chloride: 99 mmol/L (ref 96–106)
Creatinine, Ser: 1.01 mg/dL (ref 0.76–1.27)
Globulin, Total: 2.8 g/dL (ref 1.5–4.5)
Glucose: 113 mg/dL — ABNORMAL HIGH (ref 70–99)
Potassium: 3.8 mmol/L (ref 3.5–5.2)
Sodium: 137 mmol/L (ref 134–144)
Total Protein: 7.6 g/dL (ref 6.0–8.5)
eGFR: 106 mL/min/{1.73_m2} (ref >59)

## 2023-12-26 LAB — HCV INTERPRETATION

## 2023-12-28 ENCOUNTER — Encounter: Payer: Self-pay | Admitting: Student

## 2023-12-29 ENCOUNTER — Ambulatory Visit
Admission: RE | Admit: 2023-12-29 | Discharge: 2023-12-29 | Disposition: A | Discharge status: Home / Self Care | Source: Ambulatory Visit | Attending: Family Medicine | Admitting: Family Medicine

## 2023-12-29 DIAGNOSIS — R1010 Upper abdominal pain, unspecified: Secondary | ICD-10-CM

## 2023-12-30 ENCOUNTER — Encounter: Payer: Self-pay | Admitting: Student

## 2024-02-08 ENCOUNTER — Ambulatory Visit (INDEPENDENT_AMBULATORY_CARE_PROVIDER_SITE_OTHER): Admitting: Student

## 2024-02-08 ENCOUNTER — Other Ambulatory Visit (HOSPITAL_COMMUNITY)
Admission: RE | Admit: 2024-02-08 | Discharge: 2024-02-08 | Disposition: A | Discharge status: Home / Self Care | Source: Ambulatory Visit | Attending: Family Medicine | Admitting: Family Medicine

## 2024-02-08 ENCOUNTER — Encounter: Payer: Self-pay | Admitting: Student

## 2024-02-08 VITALS — BP 132/93 | HR 89 | Ht 74.0 in | Wt 216.6 lb

## 2024-02-08 DIAGNOSIS — R309 Painful micturition, unspecified: Secondary | ICD-10-CM

## 2024-02-08 DIAGNOSIS — G8929 Other chronic pain: Secondary | ICD-10-CM | POA: Diagnosis not present

## 2024-02-08 DIAGNOSIS — N4889 Other specified disorders of penis: Secondary | ICD-10-CM | POA: Diagnosis not present

## 2024-02-08 LAB — POCT URINALYSIS DIP (MANUAL ENTRY)
Bilirubin, UA: NEGATIVE
Blood, UA: NEGATIVE
Glucose, UA: NEGATIVE mg/dL
Ketones, POC UA: NEGATIVE mg/dL
Leukocytes, UA: NEGATIVE
Nitrite, UA: NEGATIVE
Protein Ur, POC: NEGATIVE mg/dL
Spec Grav, UA: 1.02 (ref 1.010–1.025)
Urobilinogen, UA: 0.2 U/dL
pH, UA: 5.5 (ref 5.0–8.0)

## 2024-02-08 NOTE — Patient Instructions (Addendum)
 Good to see you today.\  Blood pressure today was slightly elevated.  Please check your blood pressure multiple times in a week at the pharmacy by you and if BP is still elevated above 140/90 please return so that we can discuss medication management.  Today we collected urine sample to test your urine for STD/infection.  We have placed a referral to a specialist urologist.  Please call the number below to schedule an appointment with them.  Referral sent to  [facility] [address] [place] 2nd floor [phone]

## 2024-02-08 NOTE — Progress Notes (Signed)
    SUBJECTIVE:   CHIEF COMPLAINT / HPI:   A 28 year old male who presents with recurrent penile pain and urinary frequency.  He has experienced recurrent penile pain for about six weeks. The pain is intermittent and not limited to urination. There is no hematuria, penile discharge and urine color is normal. No fever or chills are present.  He has increased urinary frequency without incontinence or nocturia.  He has a consistent sexual partner and sometimes does not use condoms during intercourse.  PERTINENT  PMH / PSH: Reviewed   OBJECTIVE:   BP (!) 132/93   Pulse 89   Ht 6' 2 (1.88 m)   Wt 216 lb 9.6 oz (98.2 kg)   SpO2 99%   BMI 27.81 kg/m    Physical Exam General: Alert, well appearing, NAD Cardiovascular: RRR, No Murmurs, Normal S2/S2 Respiratory: CTAB, No wheezing or Rales Abdomen: No distension or tenderness Extremities: No edema on extremities   Skin: Warm and dry  ASSESSMENT/PLAN:   Penile pain, chronic Intermittent penile pain for 1.5 months, possibly due to UTI, STI, or other urological issues incuding chronic prostatitis.  - Collect urine sample for analysis to rule out infection or STD. - Provide contact information for urologist and advise scheduling an appointment. - Advise waiting for urologist evaluation before repeating blood work. - Discuss long-term monitoring if urologist evaluation is normal.   Elevated BP No official diagnosis for hypertension.  Currently asymptomatic and via shared decision patient decides he will check dilatory BPs and if above 140/90 persistently will return for medication management.  Goble Last, MD [facility]

## 2024-02-08 NOTE — Assessment & Plan Note (Signed)
 Intermittent penile pain for 1.5 months, possibly due to UTI, STI, or other urological issues incuding chronic prostatitis.  - Collect urine sample for analysis to rule out infection or STD. - Provide contact information for urologist and advise scheduling an appointment. - Advise waiting for urologist evaluation before repeating blood work. - Discuss long-term monitoring if urologist evaluation is normal.

## 2024-02-09 ENCOUNTER — Ambulatory Visit: Payer: Self-pay | Admitting: Student

## 2024-02-09 LAB — URINE CYTOLOGY ANCILLARY ONLY
Chlamydia: NEGATIVE
Comment: NEGATIVE
Comment: NEGATIVE
Comment: NORMAL
Neisseria Gonorrhea: NEGATIVE
Trichomonas: NEGATIVE

## 2024-03-05 ENCOUNTER — Encounter (HOSPITAL_COMMUNITY): Payer: Self-pay

## 2024-03-05 ENCOUNTER — Ambulatory Visit (INDEPENDENT_AMBULATORY_CARE_PROVIDER_SITE_OTHER)

## 2024-03-05 ENCOUNTER — Ambulatory Visit (HOSPITAL_COMMUNITY)
Admission: EM | Admit: 2024-03-05 | Discharge: 2024-03-05 | Disposition: A | Discharge status: Home / Self Care | Attending: Family Medicine | Admitting: Family Medicine

## 2024-03-05 DIAGNOSIS — R0602 Shortness of breath: Secondary | ICD-10-CM

## 2024-03-05 DIAGNOSIS — R0989 Other specified symptoms and signs involving the circulatory and respiratory systems: Secondary | ICD-10-CM | POA: Insufficient documentation

## 2024-03-05 DIAGNOSIS — R079 Chest pain, unspecified: Secondary | ICD-10-CM | POA: Diagnosis present

## 2024-03-05 DIAGNOSIS — K219 Gastro-esophageal reflux disease without esophagitis: Secondary | ICD-10-CM | POA: Diagnosis present

## 2024-03-05 LAB — CBC WITH DIFFERENTIAL/PLATELET
Abs Immature Granulocytes: 0.04 K/uL (ref 0.00–0.07)
Basophils Absolute: 0 K/uL (ref 0.0–0.1)
Basophils Relative: 1 %
Eosinophils Absolute: 0 K/uL (ref 0.0–0.5)
Eosinophils Relative: 1 %
HCT: 47.7 % (ref 39.0–52.0)
Hemoglobin: 15.6 g/dL (ref 13.0–17.0)
Immature Granulocytes: 1 %
Lymphocytes Relative: 29 %
Lymphs Abs: 1.2 K/uL (ref 0.7–4.0)
MCH: 27.2 pg (ref 26.0–34.0)
MCHC: 32.7 g/dL (ref 30.0–36.0)
MCV: 83.1 fL (ref 80.0–100.0)
Monocytes Absolute: 0.3 K/uL (ref 0.1–1.0)
Monocytes Relative: 8 %
Neutro Abs: 2.6 K/uL (ref 1.7–7.7)
Neutrophils Relative %: 60 %
Platelets: 128 K/uL — ABNORMAL LOW (ref 150–400)
RBC: 5.74 MIL/uL (ref 4.22–5.81)
RDW: 12.4 % (ref 11.5–15.5)
WBC: 4.2 K/uL (ref 4.0–10.5)
nRBC: 0 % (ref 0.0–0.2)

## 2024-03-05 LAB — COMPREHENSIVE METABOLIC PANEL WITH GFR
ALT: 15 U/L (ref 0–44)
AST: 18 U/L (ref 15–41)
Albumin: 4.4 g/dL (ref 3.5–5.0)
Alkaline Phosphatase: 76 U/L (ref 38–126)
Anion gap: 6 (ref 5–15)
BUN: 7 mg/dL (ref 6–20)
CO2: 27 mmol/L (ref 22–32)
Calcium: 9.5 mg/dL (ref 8.9–10.3)
Chloride: 103 mmol/L (ref 98–111)
Creatinine, Ser: 1.07 mg/dL (ref 0.61–1.24)
GFR, Estimated: >60 mL/min (ref >60)
Glucose, Bld: 101 mg/dL — ABNORMAL HIGH (ref 70–99)
Potassium: 4 mmol/L (ref 3.5–5.1)
Sodium: 136 mmol/L (ref 135–145)
Total Bilirubin: 1.3 mg/dL — ABNORMAL HIGH (ref 0.0–1.2)
Total Protein: 8.2 g/dL — ABNORMAL HIGH (ref 6.5–8.1)

## 2024-03-05 LAB — D-DIMER, QUANTITATIVE: D-Dimer, Quant: <0.27 ug{FEU}/mL (ref 0.00–0.50)

## 2024-03-05 LAB — POCT FASTING CBG KUC MANUAL ENTRY: POCT Glucose (KUC): 111 mg/dL — AB (ref 70–99)

## 2024-03-05 MED ORDER — METHYLPREDNISOLONE 4 MG PO TBPK: ORAL_TABLET | ORAL | 0 refills | Status: DC

## 2024-03-05 NOTE — Discharge Instructions (Addendum)
 You have been seen at the [facility] today for intermittent chest pain and SOB. Your evaluation today was not suggestive of any emergent condition requiring medical intervention at this time. Your chest x-ray and ECG (heart tracing) did not show any worrisome changes. However, some medical problems make take more time to appear. Therefore, it's very important that you pay attention to any new symptoms or worsening of your current condition.  Please proceed directly to the Emergency Department immediately should you feel worse in any way.  You have had labs (blood tests) sent today. We will call you with any significant abnormalities or if there is need to begin or change treatment or pursue further follow up.  You may also review your test results online through MyChart. If you do not have a MyChart account, instructions to sign up should be on your discharge paperwork.

## 2024-03-05 NOTE — ED Triage Notes (Addendum)
 Patient reports tht he has had intermittent chest pain and dizziness since yesterday. Paatient added that he feels an intermittent pumping in his left mid back and feels like blood pumping. Patient states he had SOB when he was lying down.  Patient anxious in triage.

## 2024-03-07 ENCOUNTER — Ambulatory Visit (HOSPITAL_COMMUNITY): Payer: Self-pay

## 2024-03-09 NOTE — ED Provider Notes (Signed)
 [facility]   [id number] 03/05/24 Arrival Time: 1027  ASSESSMENT & PLAN:  1. Chest pain, unspecified type   2. SOB (shortness of breath)   3. Pleural friction rub   4. Gastroesophageal reflux disease without esophagitis    Declines ED evaluation. I can draw some basic labs on him. I have personally viewed and independently interpreted the imaging studies ordered this visit. CXR: no acute changes.  ECG: Performed today and interpreted by me: normal EKG, normal sinus rhythm.  Ques pleuritic pain; discussed. With a pleural friction rub. Low susp for PE. D-dimer negative.  Trial of: Meds ordered this encounter  Medications   methylPREDNISolone  (MEDROL  DOSEPAK) 4 MG TBPK tablet    Sig: Take as directed.    Dispense:  1 each    Refill:  0  Above may worsen reflux symptoms; discussed.  Results for orders placed or performed during the hospital encounter of 03/05/24  POC CBG monitoring   Collection Time: 03/05/24 11:31 AM  Result Value Ref Range   POCT Glucose (KUC) 111 (A) 70 - 99 mg/dL  CBC with Differential/Platelet   Collection Time: 03/05/24 12:20 PM  Result Value Ref Range   WBC 4.2 4.0 - 10.5 K/uL   RBC 5.74 4.22 - 5.81 MIL/uL   Hemoglobin 15.6 13.0 - 17.0 g/dL   HCT 52.2 60.9 - 47.9 %   MCV 83.1 80.0 - 100.0 fL   MCH 27.2 26.0 - 34.0 pg   MCHC 32.7 30.0 - 36.0 g/dL   RDW 87.5 88.4 - 84.4 %   Platelets 128 (L) 150 - 400 K/uL   nRBC 0.0 0.0 - 0.2 %   Neutrophils Relative % 60 %   Neutro Abs 2.6 1.7 - 7.7 K/uL   Lymphocytes Relative 29 %   Lymphs Abs 1.2 0.7 - 4.0 K/uL   Monocytes Relative 8 %   Monocytes Absolute 0.3 0.1 - 1.0 K/uL   Eosinophils Relative 1 %   Eosinophils Absolute 0.0 0.0 - 0.5 K/uL   Basophils Relative 1 %   Basophils Absolute 0.0 0.0 - 0.1 K/uL   Immature Granulocytes 1 %   Abs Immature Granulocytes 0.04 0.00 - 0.07 K/uL  Comprehensive metabolic panel with GFR   Collection Time: 03/05/24 12:20 PM  Result Value Ref Range    Sodium 136 135 - 145 mmol/L   Potassium 4.0 3.5 - 5.1 mmol/L   Chloride 103 98 - 111 mmol/L   CO2 27 22 - 32 mmol/L   Glucose, Bld 101 (H) 70 - 99 mg/dL   BUN 7 6 - 20 mg/dL   Creatinine, Ser 8.92 0.61 - 1.24 mg/dL   Calcium 9.5 8.9 - 89.6 mg/dL   Total Protein 8.2 (H) 6.5 - 8.1 g/dL   Albumin 4.4 3.5 - 5.0 g/dL   AST 18 15 - 41 U/L   ALT 15 0 - 44 U/L   Alkaline Phosphatase 76 38 - 126 U/L   Total Bilirubin 1.3 (H) 0.0 - 1.2 mg/dL   GFR, Estimated >39 >39 mL/min   Anion gap 6 5 - 15  D-dimer, quantitative   Collection Time: 03/05/24 12:20 PM  Result Value Ref Range   D-Dimer, Quant <0.27 0.00 - 0.50 ug/mL-FEU    Chest pain precautions given. Reviewed expectations re: course of current medical issues. Questions answered. Outlined signs and symptoms indicating need for more acute intervention. Patient verbalized understanding. After Visit Summary given.   SUBJECTIVE:  History from: patient. Joshua Rush is a 28 y.o.  male who presents with complaint of intermittent LEFT upper back and LEFT upper chest pain; noted yesterday; without radiation. Has felt a little lightheaded at times. Feels like blood is pumping in my back. Did experience some SOB when he was lying down yesterday; few seconds then resolves; denies while here. Denies assoc n/v/diaphoresis. No tx PTA.  Denies recreational drug use. Does describe occasional heartburn.  Social History   Tobacco Use  Smoking Status Former  Smokeless Tobacco Never   Social History   Substance and Sexual Activity  Alcohol Use Yes   Comment: intermittently drinks > 5 drinks per day    OBJECTIVE:  Vitals:   03/05/24 1035 03/05/24 1054  BP: (!) 145/101 (!) 141/107  Pulse: 85   Temp: 97.7 F (36.5 C)   TempSrc: Oral   SpO2: 98%     General appearance: alert, oriented, no acute distress Eyes: PERRLA; EOMI; conjunctivae normal HENT: normocephalic; atraumatic Neck: supple without FROM Lungs: without labored  respirations; speaks full sentences without difficulty; CTAB except for what I think is a friction rub over upper L back around where he tells me his symptoms are Heart: regular rate and rhythm without murmer Chest Wall: without tenderness to palpation Abdomen: soft, non-tender; no guarding or rebound tenderness Extremities: without edema; without calf swelling or tenderness; symmetrical without gross deformities Skin: warm and dry; without rash or lesions Neuro: normal gait Psychological: alert and cooperative; normal mood and affect  Labs: Results for orders placed or performed during the hospital encounter of 03/05/24  POC CBG monitoring   Collection Time: 03/05/24 11:31 AM  Result Value Ref Range   POCT Glucose (KUC) 111 (A) 70 - 99 mg/dL  CBC with Differential/Platelet   Collection Time: 03/05/24 12:20 PM  Result Value Ref Range   WBC 4.2 4.0 - 10.5 K/uL   RBC 5.74 4.22 - 5.81 MIL/uL   Hemoglobin 15.6 13.0 - 17.0 g/dL   HCT 52.2 60.9 - 47.9 %   MCV 83.1 80.0 - 100.0 fL   MCH 27.2 26.0 - 34.0 pg   MCHC 32.7 30.0 - 36.0 g/dL   RDW 87.5 88.4 - 84.4 %   Platelets 128 (L) 150 - 400 K/uL   nRBC 0.0 0.0 - 0.2 %   Neutrophils Relative % 60 %   Neutro Abs 2.6 1.7 - 7.7 K/uL   Lymphocytes Relative 29 %   Lymphs Abs 1.2 0.7 - 4.0 K/uL   Monocytes Relative 8 %   Monocytes Absolute 0.3 0.1 - 1.0 K/uL   Eosinophils Relative 1 %   Eosinophils Absolute 0.0 0.0 - 0.5 K/uL   Basophils Relative 1 %   Basophils Absolute 0.0 0.0 - 0.1 K/uL   Immature Granulocytes 1 %   Abs Immature Granulocytes 0.04 0.00 - 0.07 K/uL  Comprehensive metabolic panel with GFR   Collection Time: 03/05/24 12:20 PM  Result Value Ref Range   Sodium 136 135 - 145 mmol/L   Potassium 4.0 3.5 - 5.1 mmol/L   Chloride 103 98 - 111 mmol/L   CO2 27 22 - 32 mmol/L   Glucose, Bld 101 (H) 70 - 99 mg/dL   BUN 7 6 - 20 mg/dL   Creatinine, Ser 8.92 0.61 - 1.24 mg/dL   Calcium 9.5 8.9 - 89.6 mg/dL   Total Protein 8.2 (H)  6.5 - 8.1 g/dL   Albumin 4.4 3.5 - 5.0 g/dL   AST 18 15 - 41 U/L   ALT 15 0 - 44 U/L  Alkaline Phosphatase 76 38 - 126 U/L   Total Bilirubin 1.3 (H) 0.0 - 1.2 mg/dL   GFR, Estimated >39 >39 mL/min   Anion gap 6 5 - 15  D-dimer, quantitative   Collection Time: 03/05/24 12:20 PM  Result Value Ref Range   D-Dimer, Quant <0.27 0.00 - 0.50 ug/mL-FEU   Labs Reviewed  CBC WITH DIFFERENTIAL/PLATELET - Abnormal; Notable for the following components:      Result Value   Platelets 128 (*)    All other components within normal limits  COMPREHENSIVE METABOLIC PANEL WITH GFR - Abnormal; Notable for the following components:   Glucose, Bld 101 (*)    Total Protein 8.2 (*)    Total Bilirubin 1.3 (*)    All other components within normal limits  POCT FASTING CBG KUC MANUAL ENTRY - Abnormal; Notable for the following components:   POCT Glucose (KUC) 111 (*)    All other components within normal limits  D-DIMER, QUANTITATIVE    Imaging: No results found.   No Known Allergies  Past Medical History:  Diagnosis Date   Alcohol use    Bacterial external ear infection 07/06/2020   Dyspepsia 06/18/2020   Gastritis    Headache 03/24/2021   Steatosis (HCC)    Subluxation of carpometacarpal joint of thumb 06/18/2020   Varicella non-immune 12/22/2022   Social History   Socioeconomic History   Marital status: Single    Spouse name: Not on file   Number of children: Not on file   Years of education: Not on file   Highest education level: Not on file  Occupational History   Not on file  Tobacco Use   Smoking status: Former   Smokeless tobacco: Never  Vaping Use   Vaping status: Never Used  Substance and Sexual Activity   Alcohol use: Yes    Comment: intermittently drinks > 5 drinks per day    Drug use: Never   Sexual activity: Yes    Partners: Female  Other Topics Concern   Not on file  Social History Narrative   Mother is Mbiya       Moved here with family of 9       Refugee  Information   Number of Immediate Family Members: 5   Number of Immediate Family Members in [place] : 5   Date of Arrival: 04/12/20   Country of Birth: Other   Other Country of Birth:: [place]   Country of Origin: Other   Other Country of Origin:: [place]   Location of Refugee [place]: Other   Other Location of Refugee Camp:: [place]   Duration in [place]: 20 years or greater   Reason for Leaving Home Country: Other   Other Reason for Leaving Home Country:: [facility]   Primary Language: English, Other   Other Primary Language:: [place]   Able to Read in Primary Language: Yes   Able to Write in Primary Language: Yes   Education: [employer] (did not graduate)   Prior Work: mobile money Marketing executive)   Marital Status: Single   Sexual Activity: Yes (girlfriend)   Tuberculosis Screening Overseas: Negative   Health Department Labs Completed: Yes   History of Trauma: None   Do You Feel Jumpy or Nervous?: No   Are You Very Watchful or 'Super Alert'?: No   Social Drivers of Health   Financial Resource Strain: High Risk (03/26/2021)   Overall Financial Resource Strain (CARDIA)    Difficulty of Paying Living Expenses:  Hard  Food Insecurity: Not on file  Transportation Needs: Unmet Transportation Needs (03/26/2021)   PRAPARE - Administrator, Civil Service (Medical): Yes    Lack of Transportation (Non-Medical): No  Physical Activity: Not on file  Stress: Not on file  Social Connections: Not on file  Intimate Partner Violence: Not on file   Family History  Problem Relation Age of Onset   HIV Mother    History reviewed. No pertinent surgical history.    Rolinda Rogue, MD 03/09/24 1341

## 2024-05-19 ENCOUNTER — Encounter: Payer: Self-pay | Admitting: Student

## 2024-05-19 ENCOUNTER — Other Ambulatory Visit: Payer: Self-pay | Admitting: Student

## 2024-05-19 DIAGNOSIS — K219 Gastro-esophageal reflux disease without esophagitis: Secondary | ICD-10-CM

## 2024-05-19 MED ORDER — PANTOPRAZOLE SODIUM 20 MG PO TBEC: 20.0000 mg | DELAYED_RELEASE_TABLET | Freq: Every day | ORAL | 3 refills | Status: AC

## 2024-05-19 NOTE — Progress Notes (Signed)
 Refilled Pantoprazole 20 mg daily

## 2024-09-06 ENCOUNTER — Encounter (HOSPITAL_COMMUNITY): Payer: Self-pay | Admitting: Emergency Medicine

## 2024-09-06 ENCOUNTER — Ambulatory Visit (HOSPITAL_COMMUNITY)
Admission: EM | Admit: 2024-09-06 | Discharge: 2024-09-06 | Disposition: A | Discharge status: Home / Self Care | Attending: Family Medicine | Admitting: Family Medicine

## 2024-09-06 DIAGNOSIS — R350 Frequency of micturition: Secondary | ICD-10-CM | POA: Diagnosis not present

## 2024-09-06 DIAGNOSIS — Z202 Contact with and (suspected) exposure to infections with a predominantly sexual mode of transmission: Secondary | ICD-10-CM | POA: Insufficient documentation

## 2024-09-06 DIAGNOSIS — R519 Headache, unspecified: Secondary | ICD-10-CM | POA: Insufficient documentation

## 2024-09-06 DIAGNOSIS — R1033 Periumbilical pain: Secondary | ICD-10-CM | POA: Diagnosis not present

## 2024-09-06 LAB — POCT URINE DIPSTICK
Bilirubin, UA: NEGATIVE
Blood, UA: NEGATIVE
Glucose, UA: NEGATIVE mg/dL
Ketones, POC UA: NEGATIVE mg/dL
Leukocytes, UA: NEGATIVE
Nitrite, UA: NEGATIVE
POC PROTEIN,UA: NEGATIVE
Spec Grav, UA: 1.02
Urobilinogen, UA: 1 U/dL
pH, UA: 7

## 2024-09-06 LAB — CBC
HCT: 45 % (ref 39.0–52.0)
Hemoglobin: 15.1 g/dL (ref 13.0–17.0)
MCH: 27.5 pg (ref 26.0–34.0)
MCHC: 33.6 g/dL (ref 30.0–36.0)
MCV: 82 fL (ref 80.0–100.0)
Platelets: 127 K/uL — ABNORMAL LOW (ref 150–400)
RBC: 5.49 MIL/uL (ref 4.22–5.81)
RDW: 12.1 % (ref 11.5–15.5)
WBC: 5.3 K/uL (ref 4.0–10.5)
nRBC: 0 % (ref 0.0–0.2)

## 2024-09-06 LAB — COMPREHENSIVE METABOLIC PANEL WITH GFR
ALT: 16 U/L (ref 0–44)
AST: 16 U/L (ref 15–41)
Albumin: 4.7 g/dL (ref 3.5–5.0)
Alkaline Phosphatase: 111 U/L (ref 38–126)
Anion gap: 9 (ref 5–15)
BUN: 12 mg/dL (ref 6–20)
CO2: 28 mmol/L (ref 22–32)
Calcium: 9.2 mg/dL (ref 8.9–10.3)
Chloride: 102 mmol/L (ref 98–111)
Creatinine, Ser: 0.97 mg/dL (ref 0.61–1.24)
GFR, Estimated: >60 mL/min
Glucose, Bld: 108 mg/dL — ABNORMAL HIGH (ref 70–99)
Potassium: 3.8 mmol/L (ref 3.5–5.1)
Sodium: 138 mmol/L (ref 135–145)
Total Bilirubin: 0.9 mg/dL (ref 0.0–1.2)
Total Protein: 7.8 g/dL (ref 6.5–8.1)

## 2024-09-06 LAB — HIV ANTIBODY (ROUTINE TESTING W REFLEX): HIV Screen 4th Generation wRfx: NONREACTIVE

## 2024-09-06 MED ORDER — HYDROXYZINE HCL 25 MG PO TABS: 25.0000 mg | ORAL_TABLET | Freq: Every evening | ORAL | 0 refills | Status: AC | PRN

## 2024-09-06 MED ORDER — IBUPROFEN 800 MG PO TABS: 800.0000 mg | ORAL_TABLET | Freq: Three times a day (TID) | ORAL | 0 refills | Status: AC | PRN

## 2024-09-06 NOTE — Discharge Instructions (Addendum)
 The urinalysis is clear  Staff will notify you if there is anything positive on the swab (the swab tests for gonorrhea, chlamydia, trichomonas) or on blood work. It can take 2-3 days for the tests to result, depending on the day of the week your test was taken. You will only be notified if there are any positives on the testing; test results will also go to your MyChart if you are signed up for MyChart.   Take ibuprofen  800 mg--1 tab every 8 hours as needed for pain.  Hydroxyzine  25 mg--take 1 nightly for sleep aid  Please follow-up with your primary care  Follow-up with your primary care

## 2024-09-06 NOTE — ED Provider Notes (Signed)
 " [facility]    CSN: [id number] Arrival date & time: 09/06/24  1657      History   Chief Complaint Chief Complaint  Patient presents with   Headache   Abdominal Pain   Penis Pain    HPI Joshua Rush is a 29 y.o. male.    Headache Associated symptoms: abdominal pain   Abdominal Pain Penis Pain Associated symptoms include abdominal pain and headaches.    Here for headache and periumbilical pain and penile pain.  Symptoms began on December 28  On December 28 he had sex with an ex-girlfriend.  Since then he has been concerned that he might have something.  He has had frontal headache coming and going.  He has had some periumbilical pain and some mild penile discomfort.  No dysuria or discharge or itching.  No rash on the penis or genital areas.  He has been resting less and is states he has been feeling distracted and concerned.  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 No pertinent surgical history.     Home Medications    Prior to Admission medications  Medication Sig Start Date End Date Taking? Authorizing Provider  hydrOXYzine  (ATARAX ) 25 MG tablet Take 1 tablet (25 mg total) by mouth at bedtime as needed (sleep). 09/06/24  Yes Vonna Sharlet POUR, MD  ibuprofen  (ADVIL ) 800 MG tablet Take 1 tablet (800 mg total) by mouth every 8 (eight) hours as needed (pain). 09/06/24  Yes Vonna Sharlet POUR, MD  pantoprazole  (PROTONIX ) 20 MG tablet Take 1 tablet (20 mg total) by mouth daily. 05/19/24   Rosendo Norleen BROCKS, MD  simethicone  (GAS-X) 80 MG chewable tablet Chew 1 tablet (80 mg total) by mouth every 6 (six) hours as needed (bloating). 04/20/20 06/12/20  Tellis Laneta NOVAK, NP    Family History Family History  Problem Relation Age of Onset   HIV Mother     Social History Social History[1]   Allergies   Patient has no known allergies.   Review of Systems Review of Systems  Gastrointestinal:  Positive for abdominal pain.  Genitourinary:  Positive for penile pain.  Neurological:  Positive for headaches.     Physical Exam Triage Vital Signs ED Triage Vitals  Encounter Vitals Group     BP 09/06/24 1807 (!) 145/97     Girls Systolic BP Percentile --      Girls Diastolic BP Percentile --      Boys Systolic BP Percentile --      Boys Diastolic BP Percentile --      Pulse Rate 09/06/24 1807 87     Resp 09/06/24 1807 14     Temp 09/06/24 1807 97.9 F (36.6 C)     Temp Source 09/06/24  1807 Oral     SpO2 09/06/24 1807 98 %     Weight --      Height --      Head Circumference --      Peak Flow --      Pain Score 09/06/24 1806 4     Pain Loc --      Pain Education --      Exclude from Growth Chart --    No data found.  Updated Vital Signs BP (!) 145/97 (BP Location: Right Arm)   Pulse 87   Temp 97.9 F (36.6 C) (Oral)   Resp 14   SpO2 98%   Visual Acuity Right Eye Distance:   Left Eye Distance:   Bilateral Distance:    Right Eye Near:   Left Eye Near:    Bilateral Near:     Physical Exam Vitals reviewed.  Constitutional:      General: He is not in acute distress.    Appearance: He is not ill-appearing, toxic-appearing or diaphoretic.  HENT:     Right Ear: Tympanic membrane and ear canal normal.      Left Ear: Tympanic membrane and ear canal normal.     Nose: Nose normal.     Mouth/Throat:     Mouth: Mucous membranes are moist.     Pharynx: No oropharyngeal exudate or posterior oropharyngeal erythema.  Eyes:     Extraocular Movements: Extraocular movements intact.     Conjunctiva/sclera: Conjunctivae normal.     Pupils: Pupils are equal, round, and reactive to light.  Cardiovascular:     Rate and Rhythm: Normal rate and regular rhythm.     Heart sounds: No murmur heard. Pulmonary:     Effort: Pulmonary effort is normal. No respiratory distress.     Breath sounds: Normal breath sounds. No stridor. No wheezing, rhonchi or rales.  Abdominal:     General: Bowel sounds are normal. There is no distension.     Palpations: Abdomen is soft.     Tenderness: There is no abdominal tenderness. There is no guarding.  Musculoskeletal:     Cervical back: Neck supple.  Lymphadenopathy:     Cervical: No cervical adenopathy.  Skin:    Capillary Refill: Capillary refill takes less than 2 seconds.     Coloration: Skin is not jaundiced or pale.  Neurological:     General: No focal deficit present.     Mental Status: He is alert and oriented to person, place, and time.  Psychiatric:        Behavior: Behavior normal.      UC Treatments / Results  Labs (all labs ordered are listed, but only abnormal results are displayed) Labs Reviewed  CBC  COMPREHENSIVE METABOLIC PANEL WITH GFR  SYPHILIS: RPR W/REFLEX TO RPR TITER AND TREPONEMAL ANTIBODIES, TRADITIONAL SCREENING AND DIAGNOSIS ALGORITHM  HIV ANTIBODY (ROUTINE TESTING W REFLEX)  POCT URINE DIPSTICK  CYTOLOGY, (ORAL, ANAL, URETHRAL) ANCILLARY ONLY    EKG   Radiology No results found.  Procedures Procedures (including critical care time)  Medications Ordered in UC Medications - No data to display  Initial Impression / Assessment and Plan / UC Course  I have reviewed the triage vital signs and the nursing notes.  Pertinent labs  & imaging results that were available during my care of the patient were reviewed by me and considered in my medical decision making (see chart for details).     Urinalysis is clear  Urethral self swab is done  and staff will notify him of any positives and treat per protocol. Blood is drawn for HIV and RPR, and staff will notify them if any of that is positive  CBC and CMP is drawn to further assess his symptoms, including he added some dizziness to his symptoms when I went back to discuss his urinalysis results.  Since he is not resting much I am and wondering if the headache is due to some to lack of rest.  Hydroxyzine  is sent in to help him rest better  I have asked him to follow-up with his primary care  Work note provided Final Clinical Impressions(s) / UC Diagnoses   Final diagnoses:  Periumbilical abdominal pain  Urinary frequency  Potential exposure to STD  Nonintractable headache, unspecified chronicity pattern, unspecified headache type     Discharge Instructions      The urinalysis is clear  Staff will notify you if there is anything positive on the swab (the swab tests for gonorrhea, chlamydia, trichomonas) or on blood work. It can take 2-3 days for the tests to result, depending on the day of the week your test was taken. You will only be notified if there are any positives on the testing; test results will also go to your MyChart if you are signed up for MyChart.   Take ibuprofen  800 mg--1 tab every 8 hours as needed for pain.  Hydroxyzine  25 mg--take 1 nightly for sleep aid  Please follow-up with your primary care  Follow-up with your primary care       ED Prescriptions     Medication Sig Dispense Auth. Provider   hydrOXYzine  (ATARAX ) 25 MG tablet Take 1 tablet (25 mg total) by mouth at bedtime as needed (sleep). 10 tablet Vonna Sharlet POUR, MD   ibuprofen  (ADVIL ) 800 MG tablet Take 1 tablet (800 mg total) by mouth every 8 (eight) hours as needed  (pain). 21 tablet Sira Adsit K, MD      PDMP not reviewed this encounter.    [1]  Social History Tobacco Use   Smoking status: Former   Smokeless tobacco: Never  Vaping Use   Vaping status: Never Used  Substance Use Topics   Alcohol use: Yes    Comment: intermittently drinks > 5 drinks per day    Drug use: Never     Vonna Sharlet POUR, MD 09/06/24 1935  "

## 2024-09-06 NOTE — ED Triage Notes (Signed)
 Pt reports having intermittent headache, abd pains and penile pain since December. Took ibuprofen  on Saturday.

## 2024-09-07 ENCOUNTER — Ambulatory Visit (HOSPITAL_COMMUNITY): Payer: Self-pay

## 2024-09-07 LAB — CYTOLOGY, (ORAL, ANAL, URETHRAL) ANCILLARY ONLY
Chlamydia: NEGATIVE
Comment: NEGATIVE
Comment: NEGATIVE
Comment: NORMAL
Neisseria Gonorrhea: NEGATIVE
Trichomonas: NEGATIVE

## 2024-09-07 LAB — SYPHILIS: RPR W/REFLEX TO RPR TITER AND TREPONEMAL ANTIBODIES, TRADITIONAL SCREENING AND DIAGNOSIS ALGORITHM: RPR Ser Ql: NONREACTIVE
